# Patient Record
Sex: Female | Born: 1975 | Hispanic: Yes | Marital: Married | Smoking: Never smoker
Health system: Southern US, Community
[De-identification: ages and names within clinical notes are randomized; demographics above are authoritative.]

## PROBLEM LIST (undated history)

## (undated) DIAGNOSIS — J302 Other seasonal allergic rhinitis: Secondary | ICD-10-CM

## (undated) DIAGNOSIS — G43909 Migraine, unspecified, not intractable, without status migrainosus: Secondary | ICD-10-CM

## (undated) DIAGNOSIS — I209 Angina pectoris, unspecified: Secondary | ICD-10-CM

## (undated) DIAGNOSIS — F419 Anxiety disorder, unspecified: Secondary | ICD-10-CM

## (undated) DIAGNOSIS — C801 Malignant (primary) neoplasm, unspecified: Secondary | ICD-10-CM

## (undated) DIAGNOSIS — Z923 Personal history of irradiation: Secondary | ICD-10-CM

## (undated) HISTORY — PX: TUBAL LIGATION: SHX77

## (undated) HISTORY — PX: ABDOMINAL HYSTERECTOMY: SHX81

## (undated) HISTORY — DX: Malignant (primary) neoplasm, unspecified: C80.1

---

## 2005-03-28 ENCOUNTER — Inpatient Hospital Stay: Payer: Self-pay

## 2005-08-31 ENCOUNTER — Ambulatory Visit: Payer: Self-pay | Admitting: Otolaryngology

## 2007-09-12 ENCOUNTER — Ambulatory Visit: Payer: Self-pay | Admitting: Family Medicine

## 2009-10-04 ENCOUNTER — Emergency Department: Payer: Self-pay | Admitting: Emergency Medicine

## 2009-12-10 ENCOUNTER — Emergency Department: Payer: Self-pay | Admitting: Emergency Medicine

## 2011-01-16 ENCOUNTER — Emergency Department: Payer: Self-pay | Admitting: Emergency Medicine

## 2011-05-13 ENCOUNTER — Ambulatory Visit: Payer: Self-pay | Admitting: Obstetrics and Gynecology

## 2012-05-14 ENCOUNTER — Ambulatory Visit: Payer: Self-pay

## 2015-03-27 ENCOUNTER — Encounter: Payer: Self-pay | Admitting: *Deleted

## 2015-03-27 ENCOUNTER — Emergency Department: Payer: BLUE CROSS/BLUE SHIELD

## 2015-03-27 ENCOUNTER — Emergency Department
Admission: EM | Admit: 2015-03-27 | Discharge: 2015-03-27 | Disposition: A | Discharge status: Home / Self Care | Payer: BLUE CROSS/BLUE SHIELD | Attending: Emergency Medicine | Admitting: Emergency Medicine

## 2015-03-27 DIAGNOSIS — M79602 Pain in left arm: Secondary | ICD-10-CM | POA: Diagnosis not present

## 2015-03-27 DIAGNOSIS — R0602 Shortness of breath: Secondary | ICD-10-CM | POA: Diagnosis not present

## 2015-03-27 DIAGNOSIS — R1013 Epigastric pain: Secondary | ICD-10-CM | POA: Diagnosis not present

## 2015-03-27 DIAGNOSIS — R079 Chest pain, unspecified: Secondary | ICD-10-CM | POA: Diagnosis present

## 2015-03-27 LAB — BASIC METABOLIC PANEL
Anion gap: 4 — ABNORMAL LOW (ref 5–15)
BUN: 16 mg/dL (ref 6–20)
CALCIUM: 9 mg/dL (ref 8.9–10.3)
CHLORIDE: 109 mmol/L (ref 101–111)
CO2: 26 mmol/L (ref 22–32)
CREATININE: 0.7 mg/dL (ref 0.44–1.00)
GFR calc non Af Amer: >60 mL/min (ref >60)
Glucose, Bld: 106 mg/dL — ABNORMAL HIGH (ref 65–99)
Potassium: 3.6 mmol/L (ref 3.5–5.1)
Sodium: 139 mmol/L (ref 135–145)

## 2015-03-27 LAB — CBC
HCT: 40.5 % (ref 35.0–47.0)
Hemoglobin: 13.5 g/dL (ref 12.0–16.0)
MCH: 29.3 pg (ref 26.0–34.0)
MCHC: 33.4 g/dL (ref 32.0–36.0)
MCV: 87.8 fL (ref 80.0–100.0)
PLATELETS: 247 10*3/uL (ref 150–440)
RBC: 4.61 MIL/uL (ref 3.80–5.20)
RDW: 13 % (ref 11.5–14.5)
WBC: 8.9 10*3/uL (ref 3.6–11.0)

## 2015-03-27 LAB — TROPONIN I

## 2015-03-27 MED ORDER — GI COCKTAIL ~~LOC~~
30.0000 mL | Freq: Once | ORAL | Status: AC
  Administered 2015-03-27: 30 mL via ORAL
  Filled 2015-03-27: qty 30

## 2015-03-27 MED ORDER — OMEPRAZOLE 40 MG PO CPDR: 40.0000 mg | DELAYED_RELEASE_CAPSULE | Freq: Every day | ORAL | Status: DC

## 2015-03-27 NOTE — Discharge Instructions (Signed)
Please establish a primary care physician at the [facility] clinic for follow-up.  Please return to the emergency department if you develop severe pain, shortness of breath, palpitations, fainting, or any other symptoms concerning to you.

## 2015-03-27 NOTE — ED Notes (Signed)
Pt reports central chest pain radiating to left arm started yesterday

## 2015-03-27 NOTE — ED Notes (Signed)
MD at bedside. 

## 2015-03-27 NOTE — ED Provider Notes (Signed)
[facility] Emergency Department Provider Note  ____________________________________________  Time seen: Approximately 9:13 AM  I have reviewed the triage [place] signs and the nursing notes.   HISTORY  Chief Complaint Chest Pain    HPI Sherry Short is a 39 y.o. female ,otherwise healthy, presenting with chest pain and shortness of breath.Patient reports that for 6 months she's been having intermittent chest pain. She describes that it started when she was on vacation in [place] in July. She describes a pressure sensation in the left side of the chest that is associated with shortness of breath. It occurs approximately once weekly and can be brief or last for several days. It is better if she sits up but worse if she lies down. It is worse at night. It is not a burning sensation or related to food. She does not have associated burping. She denies any associated palpitations, leg swelling or calf pain, pleuritic component, nausea or vomiting, or fever. Her last episode started yesterday and is persistent today and is associated with left arm pain. She has not seen another physician about these symptoms.  SH: Denies tobacco or cocaine abuse FH: No family history of early CAD or blood clots. History reviewed. No pertinent past medical history.  There are no active problems to display for this patient.   History reviewed. No pertinent past surgical history.  Current Outpatient Rx  Name  Route  Sig  Dispense  Refill  . omeprazole (PRILOSEC) 40 MG capsule   Oral   Take 1 capsule (40 mg total) by mouth daily.   30 capsule   0     Allergies Review of patient's allergies indicates no known allergies.  No family history on file.  Social History Social History  Substance Use Topics  . Smoking status: Never Smoker   . Smokeless tobacco: None  . Alcohol Use: No    Review of Systems Constitutional: No fever/chills. No lightheadedness or syncope. No  diaphoresis Eyes: No visual changes. ENT: No sore throat. Cardiovascular: Positive chest pain, without palpitations. Respiratory: Positive shortness of breath.  No cough. Gastrointestinal: No abdominal pain.  No nausea, no vomiting.  No diarrhea.  No constipation. Genitourinary: Negative for dysuria. Musculoskeletal: Negative for back pain. No lower extremity swelling or calf pain.  Skin: Negative for rash. Neurological: Negative for headaches, focal weakness or numbness.  10-point ROS otherwise negative.  ____________________________________________   PHYSICAL EXAM:  [place] SIGNS: ED Triage Vitals  Enc Vitals Group     BP 03/27/15 0853 104/70 mmHg     Pulse Rate 03/27/15 0853 69     Resp 03/27/15 0853 18     Temp 03/27/15 0853 97.8 F (36.6 C)     Temp Source 03/27/15 0853 Oral     SpO2 03/27/15 0853 99 %     Weight 03/27/15 0851 120 lb (54.432 kg)     Height 03/27/15 0851 5\' 1"  (1.549 m)     Head Cir --      Peak Flow --      Pain Score 03/27/15 0851 8     Pain Loc --      Pain Edu? --      Excl. in [place]? --     Constitutional: Alert and oriented. Well appearing and in no acute distress. Answer question appropriately. Eyes: Conjunctivae are normal.  EOMI. no scleral icterus Head: Atraumatic. Nose: No congestion/rhinnorhea. Mouth/Throat: Mucous membranes are moist.  Neck: No stridor.  Supple.  No JVD Cardiovascular: Normal  rate, regular rhythm. No murmurs, rubs or gallops.  Respiratory: Normal respiratory effort.  No retractions. Lungs CTAB.  No wheezes, rales or ronchi. Gastrointestinal: Abdomen is soft, and nondistended. Minimal pain in the epigastric area and in the lower abdomen without rebound, guarding or peritoneal signs. Musculoskeletal: No LE edema. No palpable cords or tenderness to palpation in the calves. Negative Homans sign. Neurologic:  Normal speech and language. No gross focal neurologic deficits are appreciated.  Skin:  Skin is warm, dry and intact. No  rash noted. Psychiatric: Mood and affect are normal. Speech and behavior are normal.  Normal judgement.  ____________________________________________   LABS (all labs ordered are listed, but only abnormal results are displayed)  Labs Reviewed  BASIC METABOLIC PANEL - Abnormal; Notable for the following:    Glucose, Bld 106 (*)    Anion gap 4 (*)    All other components within normal limits  CBC  TROPONIN I   ____________________________________________  EKG  ED ECG REPORT I, Eula Listen, the attending physician, personally viewed and interpreted this ECG.   Date: 03/27/2015  EKG Time: 855  Rate: 73  Rhythm: normal sinus rhythm  Axis: Normal  Intervals:none  ST&T Change: Nonspecific T-wave inversion in V1. No ST elevation. No ischemic changes.  ____________________________________________  RADIOLOGY  No results found.  ____________________________________________   PROCEDURES  Procedure(s) performed: None  Critical Care performed: No ____________________________________________   INITIAL IMPRESSION / ASSESSMENT AND PLAN / ED COURSE  Pertinent labs & imaging results that were available during my care of the patient were reviewed by me and considered in my medical decision making (see chart for details).  39 y.o. female who is otherwise healthy and has no significant personal or family risk factors presenting with left-sided chest pain and shortness of breath episodes over the past 6 months. This would be an atypical presentation for ACS or MI, and a primary pulmonary pathology such as pneumonia or pneumothorax is very unlikely. She is not tachycardic, hypoxic, and has no sign of DVT or significant risk factors so PE is also very unlikely. Some of her signs and symptoms are suggestive of reflux disease so will evaluate her with a preliminary workup in the emergency department but if it is negative I anticipate discharging her home on an H2 blocker with  close PMD follow-up.  ____________________________________________  FINAL CLINICAL IMPRESSION(S) / ED DIAGNOSES  Final diagnoses:  Chest pain, unspecified chest pain type  Shortness of breath  Left arm pain      NEW MEDICATIONS STARTED DURING THIS VISIT:  Discharge Medication List as of 03/27/2015 10:06 AM    START taking these medications   Details  omeprazole (PRILOSEC) 40 MG capsule Take 1 capsule (40 mg total) by mouth daily., Starting 03/27/2015, Until Sat 03/26/16, Print          Eula Listen, MD 04/15/15 2247

## 2015-08-09 ENCOUNTER — Encounter: Payer: Self-pay | Admitting: Emergency Medicine

## 2015-08-09 ENCOUNTER — Emergency Department: Payer: BLUE CROSS/BLUE SHIELD

## 2015-08-09 ENCOUNTER — Emergency Department
Admission: EM | Admit: 2015-08-09 | Discharge: 2015-08-09 | Disposition: A | Discharge status: Home / Self Care | Payer: BLUE CROSS/BLUE SHIELD | Attending: Emergency Medicine | Admitting: Emergency Medicine

## 2015-08-09 DIAGNOSIS — G43909 Migraine, unspecified, not intractable, without status migrainosus: Secondary | ICD-10-CM | POA: Diagnosis present

## 2015-08-09 DIAGNOSIS — L03213 Periorbital cellulitis: Secondary | ICD-10-CM

## 2015-08-09 DIAGNOSIS — R609 Edema, unspecified: Secondary | ICD-10-CM

## 2015-08-09 HISTORY — DX: Migraine, unspecified, not intractable, without status migrainosus: G43.909

## 2015-08-09 HISTORY — DX: Other seasonal allergic rhinitis: J30.2

## 2015-08-09 LAB — URINALYSIS COMPLETE WITH MICROSCOPIC (ARMC ONLY)
BACTERIA UA: NONE SEEN
Bilirubin Urine: NEGATIVE
GLUCOSE, UA: NEGATIVE mg/dL
Ketones, ur: NEGATIVE mg/dL
Nitrite: NEGATIVE
Protein, ur: NEGATIVE mg/dL
Specific Gravity, Urine: 1.02 (ref 1.005–1.030)
pH: 5 (ref 5.0–8.0)

## 2015-08-09 LAB — BASIC METABOLIC PANEL
Anion gap: 7 (ref 5–15)
BUN: 16 mg/dL (ref 6–20)
CALCIUM: 9.2 mg/dL (ref 8.9–10.3)
CO2: 22 mmol/L (ref 22–32)
CREATININE: 1.02 mg/dL — AB (ref 0.44–1.00)
Chloride: 110 mmol/L (ref 101–111)
GFR calc Af Amer: >60 mL/min (ref >60)
GFR calc non Af Amer: >60 mL/min (ref >60)
GLUCOSE: 98 mg/dL (ref 65–99)
Potassium: 4.1 mmol/L (ref 3.5–5.1)
Sodium: 139 mmol/L (ref 135–145)

## 2015-08-09 LAB — LIPASE, BLOOD: Lipase: 19 U/L (ref 11–51)

## 2015-08-09 LAB — CBC
HCT: 40.2 % (ref 35.0–47.0)
HEMOGLOBIN: 13.6 g/dL (ref 12.0–16.0)
MCH: 29.4 pg (ref 26.0–34.0)
MCHC: 33.8 g/dL (ref 32.0–36.0)
MCV: 87.2 fL (ref 80.0–100.0)
PLATELETS: 274 10*3/uL (ref 150–440)
RBC: 4.61 MIL/uL (ref 3.80–5.20)
RDW: 13.5 % (ref 11.5–14.5)
WBC: 11.5 10*3/uL — ABNORMAL HIGH (ref 3.6–11.0)

## 2015-08-09 LAB — HEPATIC FUNCTION PANEL
ALBUMIN: 4.3 g/dL (ref 3.5–5.0)
ALK PHOS: 68 U/L (ref 38–126)
ALT: 13 U/L — ABNORMAL LOW (ref 14–54)
AST: 18 U/L (ref 15–41)
Bilirubin, Direct: <0.1 mg/dL — ABNORMAL LOW (ref 0.1–0.5)
TOTAL PROTEIN: 7.5 g/dL (ref 6.5–8.1)
Total Bilirubin: 0.7 mg/dL (ref 0.3–1.2)

## 2015-08-09 LAB — TROPONIN I

## 2015-08-09 MED ORDER — AMOXICILLIN-POT CLAVULANATE 875-125 MG PO TABS: 1.0000 | ORAL_TABLET | Freq: Two times a day (BID) | ORAL | Status: AC

## 2015-08-09 MED ORDER — IOPAMIDOL (ISOVUE-300) INJECTION 61%
75.0000 mL | Freq: Once | INTRAVENOUS | Status: AC | PRN
  Administered 2015-08-09: 75 mL via INTRAVENOUS

## 2015-08-09 NOTE — ED Provider Notes (Signed)
[facility] Emergency Department Provider Note   ____________________________________________  Time seen: Approximately 11:09 AM  I have reviewed the triage [place] signs and the nursing notes.   HISTORY  Chief Complaint Migraine   HPI Sherry Short is a 40 y.o. female patient reports she has chronic daily headaches and has been having this for at least 5 years. The headaches are tight diffuse and achy. She has had 2 days of pain and swelling around the right eye as well. Area is red and puffy and tender. She is not running a fever. Patient also complains of abdominal pain for about at least the last 2 years. She occasionally has some dizziness no nausea vomiting or diarrhea. Abdominal pain is diffuse, achy. Patient also complains of seasonal allergies runny eyes and sneezing and occasional cough. )**} Past Medical History  Diagnosis Date  . Migraine headache   . Seasonal allergies     There are no active problems to display for this patient.   History reviewed. No pertinent past surgical history.  Current Outpatient Rx  Name  Route  Sig  Dispense  Refill  . amoxicillin-clavulanate (AUGMENTIN) 875-125 MG tablet   Oral   Take 1 tablet by mouth every 12 (twelve) hours. For sinus/allergies.         . fexofenadine (ALLEGRA) 180 MG tablet   Oral   Take 180 mg by mouth daily as needed for allergies or rhinitis.         Marland Kitchen amoxicillin-clavulanate (AUGMENTIN) 875-125 MG tablet   Oral   Take 1 tablet by mouth 2 (two) times daily. Take with food   20 tablet   0   . omeprazole (PRILOSEC) 40 MG capsule   Oral   Take 1 capsule (40 mg total) by mouth daily.   30 capsule   0     Allergies Review of patient's allergies indicates no known allergies.  No family history on file.  Social History Social History  Substance Use Topics  . Smoking status: Never Smoker   . Smokeless tobacco: None  . Alcohol Use: No    Review of  Systems Constitutional: No fever/chills Eyes: No visual changes. ENT: No sore throat. Cardiovascular: Denies chest pain. Respiratory: Denies shortness of breath. Gastrointestinal: See history of present illness Genitourinary: Negative for dysuria. Musculoskeletal: Negative for back pain. Skin: Negative for rash. Neurological: Negative for focal weakness or numbness.  10-point ROS otherwise negative.  ____________________________________________   PHYSICAL EXAM:  [place] SIGNS: ED Triage Vitals  Enc Vitals Group     BP 08/09/15 0725 110/68 mmHg     Pulse Rate 08/09/15 0725 81     Resp 08/09/15 0725 18     Temp 08/09/15 0725 98.2 F (36.8 C)     Temp Source 08/09/15 0725 Oral     SpO2 08/09/15 0725 98 %     Weight 08/09/15 0725 130 lb (58.968 kg)     Height 08/09/15 0725 5\' 4"  (1.626 m)     Head Cir --      Peak Flow --      Pain Score 08/09/15 0725 9     Pain Loc --      Pain Edu? --      Excl. in [place]? --     Constitutional: Alert and oriented. Well appearing and in no acute distress. Eyes: Conjunctivae are normal. PERRL. EOMI.There is redness and swelling around the right eye including the eyelid lower eyelid in the upper part of  the maxillary area. This is tender to palpation the eyes open. Head: Atraumatic. Nose: No congestion/rhinnorhea. Mouth/Throat: Mucous membranes are moist.  Oropharynx non-erythematous. Neck: No stridor.   Cardiovascular: Normal rate, regular rhythm. Grossly normal heart sounds.  Good peripheral circulation. Respiratory: Normal respiratory effort.  No retractions. Lungs CTAB. Gastrointestinal: Soft and mildly diffusely tender. Patient reports this is chronic. No distention. No abdominal bruits. No CVA tenderness. Musculoskeletal: No lower extremity tenderness nor edema.  No joint effusions. Neurologic:  Normal speech and language. No gross focal neurologic deficits are appreciated. No gait instability. Skin:  Skin is warm, dry and intact. No  rash noted. Psychiatric: Mood and affect are normal. Speech and behavior are normal.  ____________________________________________   LABS (all labs ordered are listed, but only abnormal results are displayed)  Labs Reviewed  BASIC METABOLIC PANEL - Abnormal; Notable for the following:    Creatinine, Ser 1.02 (*)    All other components within normal limits  CBC - Abnormal; Notable for the following:    WBC 11.5 (*)    All other components within normal limits  URINALYSIS COMPLETEWITH MICROSCOPIC ([facility] ONLY) - Abnormal; Notable for the following:    Color, Urine YELLOW (*)    APPearance CLEAR (*)    Hgb urine dipstick 2+ (*)    Leukocytes, UA TRACE (*)    Squamous Epithelial / LPF 6-30 (*)    All other components within normal limits  HEPATIC FUNCTION PANEL - Abnormal; Notable for the following:    ALT 13 (*)    Bilirubin, Direct <0.1 (*)    All other components within normal limits  TROPONIN I  LIPASE, BLOOD   ____________________________________________  EKG  EKG read and interpreted by me shows normal sinus rhythm rate of 76 normal axis no acute ST-T wave changes ____________________________________________  RADIOLOGY  CT of the head is read as normal CT of the maxillofacial area read by radiology as consistent with cellulitis. ____________________________________________   PROCEDURES ____________________________________________   INITIAL IMPRESSION / ASSESSMENT AND PLAN / ED COURSE  Pertinent labs & imaging results that were available during my care of the patient were reviewed by me and considered in my medical decision making (see chart for details).   ____________________________________________   FINAL CLINICAL IMPRESSION(S) / ED DIAGNOSES  Final diagnoses:  Preseptal cellulitis      NEW MEDICATIONS STARTED DURING THIS VISIT:  Discharge Medication List as of 08/09/2015 11:25 AM    START taking these medications   Details  !!  amoxicillin-clavulanate (AUGMENTIN) 875-125 MG tablet Take 1 tablet by mouth 2 (two) times daily. Take with food, Starting 08/09/2015, Until Wed 08/19/15, Print     !! - Potential duplicate medications found. Please discuss with provider.       Note:  This document was prepared using Dragon voice recognition software and may include unintentional dictation errors.    Nena Polio, MD 08/09/15 [phone]

## 2015-08-09 NOTE — ED Notes (Signed)
Pt presents to ED with reports of headache, rhinorrhea and itching and watery eyes. Pt reports cough. Pt reports chest pain. Pt states has history of migraine headache.

## 2015-08-09 NOTE — Discharge Instructions (Signed)
Please return if she develops a fever and swelling gets worse she had experienced any pain with eye movement.

## 2016-04-11 DIAGNOSIS — Z923 Personal history of irradiation: Secondary | ICD-10-CM

## 2016-04-11 HISTORY — DX: Personal history of irradiation: Z92.3

## 2016-11-09 ENCOUNTER — Other Ambulatory Visit: Payer: Self-pay | Admitting: Family Medicine

## 2016-11-09 DIAGNOSIS — N6320 Unspecified lump in the left breast, unspecified quadrant: Secondary | ICD-10-CM

## 2016-11-14 ENCOUNTER — Other Ambulatory Visit: Payer: BLUE CROSS/BLUE SHIELD

## 2016-11-14 ENCOUNTER — Ambulatory Visit
Admission: RE | Admit: 2016-11-14 | Discharge: 2016-11-14 | Disposition: A | Discharge status: Home / Self Care | Payer: Medicaid Other | Source: Ambulatory Visit | Attending: Family Medicine | Admitting: Family Medicine

## 2016-11-14 DIAGNOSIS — N6320 Unspecified lump in the left breast, unspecified quadrant: Secondary | ICD-10-CM

## 2016-11-14 DIAGNOSIS — N6321 Unspecified lump in the left breast, upper outer quadrant: Secondary | ICD-10-CM | POA: Insufficient documentation

## 2016-11-14 DIAGNOSIS — R921 Mammographic calcification found on diagnostic imaging of breast: Secondary | ICD-10-CM | POA: Insufficient documentation

## 2016-11-15 ENCOUNTER — Other Ambulatory Visit: Payer: Self-pay | Admitting: Family Medicine

## 2016-11-15 DIAGNOSIS — R921 Mammographic calcification found on diagnostic imaging of breast: Secondary | ICD-10-CM

## 2016-11-15 DIAGNOSIS — N6320 Unspecified lump in the left breast, unspecified quadrant: Secondary | ICD-10-CM

## 2016-11-18 ENCOUNTER — Other Ambulatory Visit: Payer: BLUE CROSS/BLUE SHIELD

## 2016-11-30 ENCOUNTER — Ambulatory Visit
Admission: RE | Admit: 2016-11-30 | Discharge: 2016-11-30 | Disposition: A | Discharge status: Home / Self Care | Payer: BLUE CROSS/BLUE SHIELD | Source: Ambulatory Visit | Attending: Family Medicine | Admitting: Family Medicine

## 2016-11-30 DIAGNOSIS — D0512 Intraductal carcinoma in situ of left breast: Secondary | ICD-10-CM | POA: Insufficient documentation

## 2016-11-30 DIAGNOSIS — N6320 Unspecified lump in the left breast, unspecified quadrant: Secondary | ICD-10-CM

## 2016-11-30 DIAGNOSIS — Z9889 Other specified postprocedural states: Secondary | ICD-10-CM | POA: Insufficient documentation

## 2016-11-30 DIAGNOSIS — R921 Mammographic calcification found on diagnostic imaging of breast: Secondary | ICD-10-CM | POA: Insufficient documentation

## 2016-11-30 HISTORY — PX: BREAST BIOPSY: SHX20

## 2016-12-01 LAB — SURGICAL PATHOLOGY

## 2016-12-05 ENCOUNTER — Other Ambulatory Visit: Payer: Self-pay | Admitting: Family Medicine

## 2016-12-05 DIAGNOSIS — R928 Other abnormal and inconclusive findings on diagnostic imaging of breast: Secondary | ICD-10-CM

## 2016-12-05 DIAGNOSIS — R921 Mammographic calcification found on diagnostic imaging of breast: Secondary | ICD-10-CM

## 2016-12-06 ENCOUNTER — Encounter: Payer: Self-pay | Admitting: *Deleted

## 2016-12-06 NOTE — Progress Notes (Signed)
The interpreter Randall Hiss called back around 5:00 and we were able to get in touch with the patient.  She does not have a surgical preference.  Since it is after 5:00 today, I will need to call her back tomorrow with her appointments.  She is agreeable.

## 2016-12-06 NOTE — Progress Notes (Signed)
  Oncology Nurse Navigator Documentation  Navigator Location: [facility] (12/06/16 1600)   )Navigator Encounter Type: Introductory phone call (12/06/16 1600)   Abnormal Finding Date: 11/14/16 (12/06/16 1600) Confirmed Diagnosis Date: 12/02/16 (12/06/16 1600)                                                  Called patient's ordering physician, Dr. Mariana Arn to see if they wanted the navigator to schedule the patient a surgical consult and medical oncology consult.  Dr. Sherilyn Cooter was not in the office, but per the covering MD, Dr. Fulton Mole, it is ok to assist with scheduling the patient.  I tried to call the patient via Bonnita Nasuti, the interpreter, but there was no answer.  I will try again tomorrow.

## 2016-12-08 NOTE — Progress Notes (Signed)
  Oncology Nurse Navigator Documentation  Navigator Location: [facility] (12/08/16 1400)   )Navigator Encounter Type: Telephone (12/08/16 1400)                       Treatment Phase: Pre-Tx/Tx Discussion (12/08/16 1400) Barriers/Navigation Needs: Coordination of Care (12/08/16 1400)   Interventions: Coordination of Care (12/08/16 1400)   Coordination of Care: Appts (12/08/16 1400)                  Time Spent with Patient: 30 (12/08/16 1400)   Phoned patient with interpreter interpreter Lannie Fields [place] to confirm appointments with surgery, and Med/onc.  Patient is scheduled with Dr. Rochel Brome on 12/15/16 at 10:30 for surgical consult, and at 2:00 with Dr. Grayland Ormond for Med/Onc consult.

## 2016-12-09 ENCOUNTER — Ambulatory Visit: Payer: BLUE CROSS/BLUE SHIELD | Admitting: Oncology

## 2016-12-13 ENCOUNTER — Ambulatory Visit
Admission: RE | Admit: 2016-12-13 | Discharge: 2016-12-13 | Disposition: A | Discharge status: Home / Self Care | Payer: Medicaid Other | Source: Ambulatory Visit | Attending: Family Medicine | Admitting: Family Medicine

## 2016-12-13 DIAGNOSIS — N6092 Unspecified benign mammary dysplasia of left breast: Secondary | ICD-10-CM | POA: Insufficient documentation

## 2016-12-13 DIAGNOSIS — R928 Other abnormal and inconclusive findings on diagnostic imaging of breast: Secondary | ICD-10-CM

## 2016-12-13 DIAGNOSIS — R921 Mammographic calcification found on diagnostic imaging of breast: Secondary | ICD-10-CM

## 2016-12-13 DIAGNOSIS — C801 Malignant (primary) neoplasm, unspecified: Secondary | ICD-10-CM

## 2016-12-13 HISTORY — DX: Malignant (primary) neoplasm, unspecified: C80.1

## 2016-12-13 HISTORY — PX: BREAST BIOPSY: SHX20

## 2016-12-14 DIAGNOSIS — D0512 Intraductal carcinoma in situ of left breast: Secondary | ICD-10-CM | POA: Insufficient documentation

## 2016-12-14 LAB — SURGICAL PATHOLOGY

## 2016-12-14 NOTE — Progress Notes (Signed)
Sherry Short  [phone] [phone]  ID: Sherry Short OB: Sep 19, 1975  MR#: [id number]  AFB#:[id number]  Patient Care Team: Patient, No Pcp Per as PCP - General (General Practice)  CHIEF COMPLAINT: Left breast DCIS.  INTERVAL HISTORY: Patient is a 41 year old female who recently noticed an abnormal lump on self breast examination. 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 She is anxious, but otherwise feels well. She has no neurologic complaints. She denies any recent fevers or illnesses. She has good appetite and denies weight loss. She denies any pain. She has no chest pain or shortness of breath. She denies any nausea, vomiting, constipation, or diarrhea. She has no urinary complaints. Patient feels at her baseline and offers no further specific complaints today.  REVIEW OF SYSTEMS:   Review of Systems  Constitutional: Negative.  Negative for fever, malaise/fatigue and weight loss.  Respiratory: Negative.  Negative for cough and shortness of breath.   Cardiovascular: Negative.  Negative for chest pain and leg swelling.  Gastrointestinal: Negative.  Negative for abdominal pain.  Genitourinary: Negative.  Negative for dysuria.  Musculoskeletal: Negative.   Skin: Negative.  Negative for rash.  Neurological: Negative.  Negative for sensory change and weakness.  Psychiatric/Behavioral: The patient is nervous/anxious.     As per HPI. Otherwise, a complete review of systems is negative.  PAST MEDICAL HISTORY: Past Medical History:  Diagnosis Date  . Migraine headache   . Seasonal allergies     PAST SURGICAL HISTORY: Past Surgical History:  Procedure Laterality Date  . BREAST BIOPSY Left 11/30/2016   left breast calcs path pending  . BREAST BIOPSY Left 12/13/2016   Left Affrim Bx- Path pending  . BREAST BIOPSY Left 12/13/2016   Left Affirm Bx- Path pending    FAMILY HISTORY: Family History  Problem  Relation Age of Onset  . Breast cancer Sister 81    ADVANCED DIRECTIVES (Y/N):  N  HEALTH MAINTENANCE: Social History  Substance Use Topics  . Smoking status: Never Smoker  . Smokeless tobacco: Not on file  . Alcohol use No     Colonoscopy:  PAP:  Bone density:  Lipid panel:  No Known Allergies  Current Outpatient Prescriptions  Medication Sig Dispense Refill  . acetaminophen (TYLENOL) 325 MG tablet Take 650 mg by mouth every 6 (six) hours as needed.     No current facility-administered medications for this visit.     OBJECTIVE: Vitals:   12/15/16 1420  BP: 128/72  Pulse: 73  Resp: 20  Temp: (!) 97.1 F (36.2 C)     Body mass index is 23.67 kg/m.    ECOG FS:0 - Asymptomatic  General: Well-developed, well-nourished, no acute distress. Eyes: Pink conjunctiva, anicteric sclera. HEENT: Normocephalic, moist mucous membranes, clear oropharnyx. Breasts: Patient requested exam be deferred today. Lungs: Clear to auscultation bilaterally. Heart: Regular rate and rhythm. No rubs, murmurs, or gallops. Abdomen: Soft, nontender, nondistended. No organomegaly noted, normoactive bowel sounds. Musculoskeletal: No edema, cyanosis, or clubbing. Neuro: Alert, answering all questions appropriately. Cranial nerves grossly intact. Skin: No rashes or petechiae noted. Psych: Normal affect. Lymphatics: No cervical, calvicular, axillary or inguinal LAD.   LAB RESULTS:  Lab Results  Component Value Date   NA 139 08/09/2015   K 4.1 08/09/2015   CL 110 08/09/2015   CO2 22 08/09/2015   GLUCOSE 98 08/09/2015   BUN 16 08/09/2015   CREATININE 1.02 (H) 08/09/2015   CALCIUM 9.2 08/09/2015   PROT 7.5 08/09/2015  ALBUMIN 4.3 08/09/2015   AST 18 08/09/2015   ALT 13 (L) 08/09/2015   ALKPHOS 68 08/09/2015   BILITOT 0.7 08/09/2015   GFRNONAA >60 08/09/2015   GFRAA >60 08/09/2015    Lab Results  Component Value Date   WBC 11.5 (H) 08/09/2015   HGB 13.6 08/09/2015   HCT 40.2  08/09/2015   MCV 87.2 08/09/2015   PLT 274 08/09/2015     STUDIES: Mm Clip Placement Left  Result Date: 12/13/2016 CLINICAL DATA:  Two stereotactic biopsies of the left breast were performed today. These include calcifications targeted in the lower inner quadrant of the left breast and calcifications in the central left breast. 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 EXAM: DIAGNOSTIC LEFT MAMMOGRAM POST STEREOTACTIC BIOPSIES COMPARISON:  Previous exam(s). FINDINGS: Mammographic images were obtained following stereotactic guided biopsies of the lower inner quadrant of the left breast and the central left breast. A coil shaped biopsy clip is present along the anterior aspect of the biopsied calcifications in the lower inner quadrant of the left breast. Please note that this biopsy clip is approximately 4 cm posterior, inferior, and medial to the ribbon shaped biopsy clip at the site of the recently diagnosed ductal carcinoma in situ in the retroareolar left breast. An X shaped biopsy clip was placed during the second biopsy of calcifications in the central left breast. This X shaped biopsy clip migrated superiorly. It is positioned approximately 3.5 cm superior to the calcifications in the central left breast and should not be used for targeting purposes. There are still visible calcifications within this small group that could be targeted if necessary. IMPRESSION: 1. Satisfactory position of a coil shaped biopsy clip along the anterior aspect of the calcifications in the lower inner quadrant of the left breast. 2. The X shaped biopsy clip migrated 3.5 cm superior to the small group of calcifications which were targeted for biopsy in the central left breast. At least some of these calcifications remain visible and could be targeted as needed. Final Assessment: Post Procedure Mammograms for  Marker Placement Electronically Signed   By: Curlene Dolphin M.D.   On: 12/13/2016 14:47   Mm Clip Placement Left  Result Date: 11/30/2016 CLINICAL DATA:  Status post stereotactic guided core biopsy of left breast calcifications in the anterior lower inner quadrant. Status post ultrasound-guided core biopsy of a mass in the 1 o'clock location of the left breast. EXAM: DIAGNOSTIC LEFT MAMMOGRAM POST ULTRASOUND AND STEREOTACTIC GUIDED CORE BIOPSIES. COMPARISON:  Previous exam(s). FINDINGS: Mammographic images were obtained following stereotactic guided biopsy of calcifications in the anterior lower inner quadrant of the left breast. A ribbon shaped clip is identified in the lower inner quadrant as expected. A wing shaped clip is identified in the upper-outer quadrant of the left breast, associated with previously evaluated palpable mass. The clips are 15 cm apart. IMPRESSION: Tissue marker clips are in the expected locations after biopsy. Final Assessment: Post Procedure Mammograms for Marker Placement Electronically Signed   By: Nolon Nations M.D.   On: 11/30/2016 09:42   Mm Lt Breast Bx W Loc Dev 1st Lesion Image Bx Spec Stereo Guide  Result Date: 12/13/2016 CLINICAL DATA:  Left breast stereotactic biopsy was recommended of 2 additional areas in the left breast, including the lower inner quadrant, and the central left breast. The patient has a recent diagnosis of ductal carcinoma in situ following a biopsy of  calcifications in the retroareolar left breast. She was also diagnosed with a fibroepithelial lesion in the upper-outer quadrant of the left breast for which excision was recommended. A Spanish interpreter was present throughout the procedures today. This report describes the biopsy targeting the calcifications in the lower inner quadrant. EXAM: LEFT BREAST STEREOTACTIC CORE NEEDLE BIOPSY COMPARISON:  Previous exams. FINDINGS: The patient and I discussed the procedure of stereotactic-guided biopsy  including benefits and alternatives. We discussed the high likelihood of a successful procedure. We discussed the risks of the procedure including infection, bleeding, tissue injury, clip migration, and inadequate sampling. Informed written consent was given. The usual time out protocol was performed immediately prior to the procedure. Using sterile technique and 1% Lidocaine as local anesthetic, under stereotactic guidance, a 9 gauge vacuum assisted device was used to perform core needle biopsy of calcifications in the lower inner quadrant of the left breast using a medial to lateral approach. Specimen radiograph was performed showing a few faint calcifications. Specimens with calcifications are identified for pathology. Lesion quadrant: Lower inner quadrant At the conclusion of the procedure, a coil shaped tissue marker clip was deployed into the biopsy cavity. Follow-up 2-view mammogram was performed and dictated separately. IMPRESSION: Stereotactic-guided biopsy of the lower inner quadrant of the left breast. No apparent complications. Electronically Signed   By: Curlene Dolphin M.D.   On: 12/13/2016 14:11   Mm Lt Breast Bx W Loc Dev 1st Lesion Image Bx Spec Stereo Guide  Addendum Date: 12/02/2016   ADDENDUM REPORT: 12/02/2016 13:15 ADDENDUM: Pathology results are available. The stereotactic biopsy of left lower inner quadrant calcifications shows high-grade comedo type ductal carcinoma in situ associated with comedonecrosis and calcifications. Pathology results are concordant with imaging findings. Pathology results of an ultrasound-guided left breast mass biopsy at 1 o'clock position 10 cm from the nipple shows fibroepithelial lesion, with a differential diagnosis including fibroadenoma with prominent intracanalicular growth pattern and phyllodes tumor. Pathology results are concordant with imaging findings. Given that phyllodes tumor is in the differential diagnosis, excision is recommended. The management  of this mass will be determined after additional stereotactic biopsies are performed of the left breast, and after surgical consultation to determine if the patient is a candidate for breast conservation. Please note that there are 2 additional groups of calcifications in the left breast which have not been biopsied. Given the presence of ductal carcinoma in situ at the biopsy of the most anterior calcifications, two additional stereotactic biopsies are recommended for evaluation of a group of calcifications in the central/slightly inferior left breast and in the lower inner quadrant of the left breast, more posterior than the calcifications which have RAD been biopsied. I gave results to the patient by telephone today, on a joint call with a Spanish interpreter. She is aware that our office will contact her early next week to schedule two additional stereotactic biopsies of the left breast. She also understands that the biopsied mass, while not a malignancy, needs to be excised, unless mastectomy is performed. The patient reports mild soreness at the biopsy sites, but is otherwise doing well. Additionally, breast MRI could be considered given the patient's dense breast tissue, and her family history of breast cancer in her sister, who was diagnosed at age 19. Electronically Signed   By: Curlene Dolphin M.D.   On: 12/02/2016 13:15   Result Date: 12/02/2016 CLINICAL DATA:  Patient presents for stereotactic guided core biopsy of calcifications in the anterior lower inner quadrant of the left breast.  Ultrasound-guided core biopsy of a mass in the 1 o'clock location of the left breast is also performed on the same day and is dictated separately. EXAM: LEFT BREAST STEREOTACTIC CORE NEEDLE BIOPSY COMPARISON:  Previous exams. FINDINGS: The patient and I discussed the procedure of stereotactic-guided biopsy including benefits and alternatives. We discussed the high likelihood of a successful procedure. We discussed the  risks of the procedure including infection, bleeding, tissue injury, clip migration, and inadequate sampling. Informed written consent was given. The usual time out protocol was performed immediately prior to the procedure. Using sterile technique and 1% Lidocaine as local anesthetic, under stereotactic guidance, a 9 gauge vacuum assisted device was used to perform core needle biopsy of calcifications in the anterior lower inner quadrant of the left breast using a medial approach. Specimen radiograph was performed showing calcifications to be present. Specimens with calcifications are identified for pathology. Lesion quadrant: Lower inner quadrant left breast At the conclusion of the procedure, a ribbon shaped tissue marker clip was deployed into the biopsy cavity. Follow-up 2-view mammogram was performed and dictated separately. IMPRESSION: Stereotactic-guided biopsy of left breast calcifications. No apparent complications. Electronically Signed: By: Nolon Nations M.D. On: 11/30/2016 09:45   Mm Lt Breast Bx W Loc Dev Ea Ad Lesion Img Bx Spec Stereo Guide  Result Date: 12/13/2016 CLINICAL DATA:  The patient presents for biopsy of 2 separate areas of calcifications in the left breast. She was recently diagnosed with ductal carcinoma in situ of the left breast following stereotactic biopsy of retroareolar left breast calcifications. This biopsy was targeting the centrally positioned small group of calcifications in the left breast. EXAM: LEFT BREAST STEREOTACTIC CORE NEEDLE BIOPSY COMPARISON:  Previous exams. FINDINGS: The patient and I discussed the procedure of stereotactic-guided biopsy including benefits and alternatives. We discussed the high likelihood of a successful procedure. We discussed the risks of the procedure including infection, bleeding, tissue injury, clip migration, and inadequate sampling. Informed written consent was given. The usual time out protocol was performed immediately prior to the  procedure. Using sterile technique and 1% Lidocaine as local anesthetic, under stereotactic guidance, a 9 gauge vacuum assisted device was used to perform core needle biopsy of calcifications in the central left breast using a superior to inferior approach. Specimen radiograph was performed showing a few faint calcifications. Specimens with calcifications are identified for pathology. Lesion quadrant: Lower outer quadrant At the conclusion of the procedure, a X shaped tissue marker clip was deployed into the biopsy cavity. Follow-up 2-view mammogram was performed and dictated separately. IMPRESSION: Stereotactic-guided biopsy of the left breast. No apparent complications. Electronically Signed   By: Curlene Dolphin M.D.   On: 12/13/2016 15:03   [place] Lt Breast Bx W Loc Dev 1st Lesion Img Bx Spec US Guide  Result Date: 11/30/2016 CLINICAL DATA:  Patient presents for ultrasound-guided core biopsy of mass in the 1 o'clock location of the left breast. Stereotactic guided core biopsy is also performed of left breast calcifications and is dictated separately. EXAM: ULTRASOUND GUIDED LEFT BREAST CORE NEEDLE BIOPSY COMPARISON:  Previous exam(s). FINDINGS: I met with the patient and we discussed the procedure of ultrasound-guided biopsy, including benefits and alternatives. We discussed the high likelihood of a successful procedure. We discussed the risks of the procedure, including infection, bleeding, tissue injury, clip migration, and inadequate sampling. Informed written consent was given. The usual time-out protocol was performed immediately prior to the procedure. Lesion quadrant: Upper-outer quadrant left breast Using sterile technique and 1% Lidocaine as  local anesthetic, under direct ultrasound visualization, a 12 gauge spring-loaded device was used to perform biopsy of mass in the 1 o'clock location of the left breast using a lateral approach. At the conclusion of the procedure a wing shaped tissue marker clip was  deployed into the biopsy cavity. Follow up 2 view mammogram was performed and dictated separately. IMPRESSION: Ultrasound guided biopsy of left breast mass. No apparent complications. Electronically Signed   By: Nolon Nations M.D.   On: 11/30/2016 09:43    ASSESSMENT: Left breast DCIS.  PLAN:    1. Left breast DCIS: Imaging and pathology reviewed independently confirming DCIS without invasive component. Patient had a second biopsy earlier this week did not reveal any malignancy or DCIS. She has an appointment with surgery next week for further evaluation and discussion of a lumpectomy. Patient will return to clinic first week of October for further evaluation and treatment planning. If there remains no invasive component to her disease, she will not require chemotherapy and will have evaluation with radiation oncology at that time. Patient also require approximately 5 years of tamoxifen at the conclusion of her treatments. 2. Family history: Patient reports her sister had breast cancer at the age of 51 and was treated at [facility]. It is unclear if she was genetically tested. Will pursue these results. If positive or sister was untested, will recommend genetic testing for patient.  Approximately 60 minutes was spent in discussion of which greater than 50% was consultation.  The entire visit was done in the presence of an interpreter.  Patient expressed understanding and was in agreement with this plan. She also understands that She can call clinic at any time with any questions, concerns, or complaints.   Cancer Staging Ductal carcinoma in situ (DCIS) of left breast Staging form: Breast, AJCC 8th Edition - Clinical stage from 12/15/2016: Stage 0 (cTis (DCIS), cN0, cM0, ER: Positive, PR: Positive, HER2: Negative) - Signed by Lloyd Huger, MD on 12/15/2016   Lloyd Huger, MD   12/15/2016 4:56 PM

## 2016-12-15 ENCOUNTER — Inpatient Hospital Stay: Payer: Self-pay | Attending: Oncology | Admitting: Oncology

## 2016-12-15 ENCOUNTER — Other Ambulatory Visit: Payer: Self-pay

## 2016-12-15 ENCOUNTER — Encounter (INDEPENDENT_AMBULATORY_CARE_PROVIDER_SITE_OTHER): Payer: Self-pay

## 2016-12-15 DIAGNOSIS — D0512 Intraductal carcinoma in situ of left breast: Secondary | ICD-10-CM | POA: Insufficient documentation

## 2016-12-15 DIAGNOSIS — F419 Anxiety disorder, unspecified: Secondary | ICD-10-CM | POA: Insufficient documentation

## 2016-12-15 DIAGNOSIS — Z803 Family history of malignant neoplasm of breast: Secondary | ICD-10-CM | POA: Insufficient documentation

## 2016-12-15 NOTE — Progress Notes (Signed)
Patient here today for initial visit regarding DCIS.

## 2016-12-15 NOTE — Progress Notes (Signed)
  Oncology Nurse Navigator Documentation  Navigator Location: [facility]-Med Onc (12/15/16 1700)   )Navigator Encounter Type: Clinic/MDC (12/15/16 1700)                         Barriers/Navigation Needs: Coordination of Care;Education;Financial (12/15/16 1700) Education: Accessing Care/ Finding Providers;Concerns with Finances/ Eligibility;Coping with Diagnosis/ Prognosis;Newly Diagnosed Cancer Education (12/15/16 1700) Interventions: Coordination of Care (12/15/16 1700)   Coordination of Care: Appts (Financial Assistance program) (12/15/16 1700)                  Time Spent with Patient: 120 (12/15/16 1700)   Patient identified as self-pay. Has Family planning Medicaid.   Rescheduled Surgical Consult with Dr. Adonis Huguenin at [place] on 12/21/16.  Left message for DSS to see if patient qualifies for Medicaid.  Accompanied patient to first Med/Onc visit. Patient given Financial assistance formes and guidelines.  She is to find out if her sister had genetic testing at [facility] . Sister was diagnosed with breast cancer at age 24. She is now 26. Patient has 4 daughters ranging from 4-11 years old. Given Breast Cancer Treatment Handbook/folder with hospital services. Maritza Afanador interpreted consult.

## 2016-12-16 DIAGNOSIS — G43909 Migraine, unspecified, not intractable, without status migrainosus: Secondary | ICD-10-CM | POA: Insufficient documentation

## 2016-12-16 DIAGNOSIS — J302 Other seasonal allergic rhinitis: Secondary | ICD-10-CM | POA: Insufficient documentation

## 2016-12-21 ENCOUNTER — Ambulatory Visit (INDEPENDENT_AMBULATORY_CARE_PROVIDER_SITE_OTHER): Payer: Self-pay | Admitting: General Surgery

## 2016-12-21 ENCOUNTER — Encounter: Payer: Self-pay | Admitting: General Surgery

## 2016-12-21 VITALS — BP 126/80 | HR 72 | Temp 98.3°F | Ht <= 58 in | Wt 137.0 lb

## 2016-12-21 DIAGNOSIS — D0512 Intraductal carcinoma in situ of left breast: Secondary | ICD-10-CM

## 2016-12-21 NOTE — Patient Instructions (Signed)
Le vamos a ser su cirugia el 24 de Septiembre del 2018. Su cirujano va a ser el Dr. Adonis Huguenin.  Si tiene preguntas me puede llamar al [phone] Herb Grays).

## 2016-12-21 NOTE — Progress Notes (Signed)
Patient ID: Sherry Short, female   DOB: 06-09-1975, 41 y.o.   MRN: [id number]  CC: left breast DCIS  HPI Sherry Short is a 41 y.o. female presents to clinic today for evaluation of left breast DCIS. Patient has a significant family history of a sister who was diagnosed with breast cancer at the age of 35. She started having mammography at age 19 that were all negative up until this year. She reports prior to the mammography she actually felt the lump which caused her to call and schedule a mammogram. She's never had a biopsy done before. However with this lump it was found to be suspicious and a biopsy returned a result of DCIS. She's had 5 pregnancies with 4 live births. She denies having breast-fed or using any hormone supplementation. She denies tobacco use as well. Currently she denies any fevers, chills, nausea, vomiting, shortness breath, chest pain, diarrhea, constipation. She does have some tenderness to the left breast and left arm. She is otherwise in her usual state of health.  HPI  Past Medical History:  Diagnosis Date  . Cancer ([place]) 12/13/2016   Left Breast (DCIS)  . Migraine headache   . Seasonal allergies     Past Surgical History:  Procedure Laterality Date  . BREAST BIOPSY Left 11/30/2016   left breast calcs path pending  . BREAST BIOPSY Left 12/13/2016   Left Affrim Bx- Path pending  . BREAST BIOPSY Left 12/13/2016   Left Affirm Bx- Path pending    Family History  Problem Relation Age of Onset  . Breast cancer Sister 61  . Healthy Mother   . Diabetes Father     Social History Social History  Substance Use Topics  . Smoking status: Never Smoker  . Smokeless tobacco: Never Used  . Alcohol use No    No Known Allergies  Current Outpatient Prescriptions  Medication Sig Dispense Refill  . acetaminophen (TYLENOL) 325 MG tablet Take 650 mg by mouth every 6 (six) hours as needed.     No current facility-administered medications for this visit.       Review of Systems A Multi-point review of systems was asked and was negative except for the findings documented in the history of present illness  Physical Exam Blood pressure 126/80, pulse 72, temperature 98.3 F (36.8 C), temperature source Oral, height 4\' 6"  (1.372 m), weight 62.1 kg (137 lb), last menstrual period 12/05/2016. CONSTITUTIONAL: No acute distress. EYES: Pupils are equal, round, and reactive to light, Sclera are non-icteric. EARS, NOSE, MOUTH AND THROAT: The oropharynx is clear. The oral mucosa is pink and moist. Hearing is intact to voice. BREAST: Bilateral breasts were examined. Right breast without any pathologic finding, lump, bump. Normal tissue. Left breast with a palpable, tender area with Steri-Strips still attached at approximately the 1:00 position 5 cm from the nipple areolar complex. Remainder of the breast is normal on exam. LYMPH NODES:  Lymph nodes in the neck and bilateral axilla are normal. RESPIRATORY:  Lungs are clear. There is normal respiratory effort, with equal breath sounds bilaterally, and without pathologic use of accessory muscles. CARDIOVASCULAR: Heart is regular without murmurs, gallops, or rubs. GI: The abdomen is soft, nontender, and nondistended. There are no palpable masses. There is no hepatosplenomegaly. There are normal bowel sounds in all quadrants. GU: Rectal deferred.   MUSCULOSKELETAL: Normal muscle strength and tone. No cyanosis or edema.   SKIN: Turgor is good and there are no pathologic skin lesions or ulcers. NEUROLOGIC: Motor  and sensation is grossly normal. Cranial nerves are grossly intact. PSYCH:  Oriented to person, place and time. Affect is normal.  Data Reviewed Images and labs reviewed. Images show a 2.6 x 1.7 x 2.8 cm hypoechoic mass in the area of palpable abnormality with some calcifications as well. Stereotactic biopsy returned DCIS. I have personally reviewed the patient's imaging, laboratory findings and medical  records.    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 Discussed the surgical options of lumpectomy with sentinel lymph node biopsy versus mastectomy with sentinel lymph node biopsy. Discussed the risks, benefits, alternatives of each. After this discussion patient states that she'll prefer to have a lumpectomy and voiced understanding that for her to be equal to mastectomy and has to be followed by radiation therapy as well. The procedure was described in detail to include the risks of pain, bleeding, infection, possible need to return to the operating room for more tissue, possible damage to the axillary nerves. She voiced understanding and desires to proceed. We will plan to proceed to the operating room on Monday, September 24.     Time spent with the patient was 45 minutes, with more than 50% of the time spent in face-to-face education, counseling and care coordination.     Clayburn Pert, MD FACS General Surgeon 12/21/2016, 2:18 PM

## 2016-12-22 ENCOUNTER — Other Ambulatory Visit: Payer: Self-pay | Admitting: General Surgery

## 2016-12-22 ENCOUNTER — Other Ambulatory Visit: Payer: Self-pay

## 2016-12-22 ENCOUNTER — Telehealth: Payer: Self-pay

## 2016-12-22 DIAGNOSIS — D0512 Intraductal carcinoma in situ of left breast: Secondary | ICD-10-CM

## 2016-12-22 NOTE — Telephone Encounter (Signed)
The patient has been advised of the Pre Op date and time and the Surgery date.  Surgery: 01/02/17 - Dr Adonis Huguenin  - Left Breast Lump, SLN Bx, Needle LOC  Pre Op: 12/26/16 at 10:30 am, Office appt.  Patient made aware to call [phone], between 1-3 PM the day before surgery to find out what time to arrive.   The patient will arrive at the [facility] on 01/02/17 at 8:15 am.  The patient is aware of dates, times, and instructions.

## 2016-12-26 ENCOUNTER — Inpatient Hospital Stay: Admission: RE | Admit: 2016-12-26 | Payer: Medicaid Other | Source: Ambulatory Visit

## 2016-12-28 ENCOUNTER — Ambulatory Visit
Admission: RE | Admit: 2016-12-28 | Discharge: 2016-12-28 | Disposition: A | Discharge status: Home / Self Care | Payer: Self-pay | Source: Ambulatory Visit | Attending: General Surgery | Admitting: General Surgery

## 2016-12-28 ENCOUNTER — Encounter
Admission: RE | Admit: 2016-12-28 | Discharge: 2016-12-28 | Disposition: A | Discharge status: Home / Self Care | Payer: Self-pay | Source: Ambulatory Visit | Attending: General Surgery | Admitting: General Surgery

## 2016-12-28 DIAGNOSIS — J302 Other seasonal allergic rhinitis: Secondary | ICD-10-CM | POA: Insufficient documentation

## 2016-12-28 DIAGNOSIS — R002 Palpitations: Secondary | ICD-10-CM | POA: Insufficient documentation

## 2016-12-28 DIAGNOSIS — Z01818 Encounter for other preprocedural examination: Secondary | ICD-10-CM | POA: Insufficient documentation

## 2016-12-28 DIAGNOSIS — D0512 Intraductal carcinoma in situ of left breast: Secondary | ICD-10-CM | POA: Insufficient documentation

## 2016-12-28 DIAGNOSIS — Z01810 Encounter for preprocedural cardiovascular examination: Secondary | ICD-10-CM

## 2016-12-28 DIAGNOSIS — G43909 Migraine, unspecified, not intractable, without status migrainosus: Secondary | ICD-10-CM | POA: Insufficient documentation

## 2016-12-28 HISTORY — DX: Anxiety disorder, unspecified: F41.9

## 2016-12-28 NOTE — Patient Instructions (Signed)
Your procedure is scheduled on: Monday 01/02/17 Su procedimiento est programado para: Report to [place] (8:30 AM) Presntese a:    Remember: Instructions that are not followed completely may result in serious medical risk, up to and including death, or upon the discretion of your surgeon and anesthesiologist your surgery may need to be rescheduled.  Recuerde: Las instrucciones que no se siguen completamente Heritage manager en un riesgo de salud grave, incluyendo hasta la [place] o a discrecin de su cirujano y Environmental health practitioner, su ciruga se puede posponer.   __X__ 1. Do not eat food  after midnight. No gum chewing or hard candies.  No coma alimentos despus de la medianoche.  No mastique chicle ni caramelos  [place]. Puede tomar liquidos transparentes como te, cafe o gatorade, jugo de [place], te o cafe sin Chief of Staff ni crema hasta 2 horas antes de la hora programada de llegada     __X__ 2. No alcohol for 24 hours before or after surgery.    No tome alcohol durante las 24 horas antes ni despus de la [place].   ____ 3. Bring all medications with you on the day of surgery if instructed.    Lleve todos los medicamentos con usted el da de su ciruga si se le ha indicado as.   __X__ 4. Notify your doctor if there is any change in your medical condition (cold, fever,                             infections).    Informe a su mdico si hay algn cambio en su condicin mdica (resfriado, fiebre, infecciones).   Do not wear jewelry, make-up, hairpins, clips or nail polish.  No use joyas, maquillajes, pinzas/ganchos para el cabello ni esmalte de uas.  Do not wear lotions, powders, or perfumes.  No use lociones, polvos o perfumes.      Do not shave 48 hours prior to surgery. Men may shave face and neck.  No se afeite 48 horas antes de la [place].  Los hombres pueden [employer] cara y el cuello.   Do not bring valuables to the hospital.   No lleve objetos [facility] is not responsible for any belongings or valuables.  [place] no se hace responsable de ningn tipo de pertenencias u objetos de Geographical information systems officer.               Contacts, dentures or bridgework may not be worn into surgery.  Los lentes de [place], las dentaduras postizas o puentes no se pueden usar en la [place].  Leave your suitcase in the car. After surgery it may be brought to your room.  Deje su maleta en el auto.  Despus de la ciruga podr traerla a su habitacin.  For patients admitted to the hospital, discharge time is determined by your treatment team.  Para los pacientes que sean ingresados al hospital, el tiempo en el cual se le dar de alta es determinado por su                equipo de [place].   Patients discharged the day of surgery will not be allowed to drive home. A los pacientes que se les da de alta el mismo da de la ciruga no se les permitir conducir a Holiday representative.   Please read over the following fact sheets that you were given: Por favor [place]  informacin que le dieron:   MRSA Information   ____ Take these medicines the morning of surgery with A SIP OF WATER:          [employer] estas medicinas la maana de la ciruga con UN SORBO DE AGUA:  1. NONE  2.   3.   4.       5.  6.  ____ Fleet Enema (as directed)          Enema de Fleet (segn lo indicado)    __X__ Use CHG Soap as directed          Utilice el jabn de CHG segn lo indicado  ____ Use inhalers on the day of surgery          Use los inhaladores el da de la ciruga  ____ Stop metformin 2 days prior to surgery          Deje de tomar el metformin 2 das antes de la ciruga    ____ Take 1/2 of usual insulin dose the night before surgery and none on the morning of surgery           Tome la mitad de la dosis habitual de insulina la noche antes de la [place] y no tome nada en la maana de la             ciruga  ____ Stop Coumadin/Plavix/aspirin on           Deje de  tomar el Coumadin/Plavix/aspirina el da:  ____ Stop Anti-inflammatories on           Deje de tomar antiinflamatorios el da:   ____ Stop supplements until after surgery            Deje de tomar suplementos hasta despus de la ciruga  ____ Bring C-Pap to the hospital          [place] al hospital

## 2017-01-01 MED ORDER — CEFAZOLIN SODIUM-DEXTROSE 2-4 GM/100ML-% IV SOLN: 2.0000 g | INTRAVENOUS | Status: DC

## 2017-01-02 ENCOUNTER — Ambulatory Visit: Payer: Self-pay | Admitting: Anesthesiology

## 2017-01-02 ENCOUNTER — Ambulatory Visit
Admission: RE | Admit: 2017-01-02 | Discharge: 2017-01-02 | Disposition: A | Discharge status: Home / Self Care | Payer: Self-pay | Source: Ambulatory Visit | Attending: General Surgery | Admitting: General Surgery

## 2017-01-02 ENCOUNTER — Encounter
Admission: RE | Disposition: A | Discharge status: Home / Self Care | Payer: Self-pay | Source: Ambulatory Visit | Attending: General Surgery

## 2017-01-02 ENCOUNTER — Encounter
Admission: RE | Admit: 2017-01-02 | Discharge: 2017-01-02 | Disposition: A | Discharge status: Home / Self Care | Payer: Self-pay | Source: Ambulatory Visit | Attending: General Surgery | Admitting: General Surgery

## 2017-01-02 ENCOUNTER — Encounter: Payer: Self-pay | Admitting: Emergency Medicine

## 2017-01-02 ENCOUNTER — Telehealth: Payer: Self-pay

## 2017-01-02 DIAGNOSIS — N6320 Unspecified lump in the left breast, unspecified quadrant: Secondary | ICD-10-CM

## 2017-01-02 DIAGNOSIS — D0512 Intraductal carcinoma in situ of left breast: Secondary | ICD-10-CM | POA: Insufficient documentation

## 2017-01-02 DIAGNOSIS — D242 Benign neoplasm of left breast: Secondary | ICD-10-CM | POA: Insufficient documentation

## 2017-01-02 HISTORY — PX: BREAST LUMPECTOMY: SHX2

## 2017-01-02 HISTORY — PX: BREAST LUMPECTOMY WITH SENTINEL LYMPH NODE BIOPSY: SHX5597

## 2017-01-02 LAB — POCT PREGNANCY, URINE: Preg Test, Ur: NEGATIVE

## 2017-01-02 SURGERY — BREAST LUMPECTOMY WITH SENTINEL LYMPH NODE BX
Anesthesia: General | Laterality: Left | Wound class: Clean

## 2017-01-02 MED ORDER — METHYLENE BLUE 0.5 % INJ SOLN
INTRAVENOUS | Status: DC | PRN
  Administered 2017-01-02: 5 mL via SUBMUCOSAL

## 2017-01-02 MED ORDER — LACTATED RINGERS IV SOLN
INTRAVENOUS | Status: DC
  Administered 2017-01-02 (×2): via INTRAVENOUS

## 2017-01-02 MED ORDER — FENTANYL CITRATE (PF) 100 MCG/2ML IJ SOLN
INTRAMUSCULAR | Status: AC
  Filled 2017-01-02: qty 2

## 2017-01-02 MED ORDER — PROPOFOL 10 MG/ML IV BOLUS
INTRAVENOUS | Status: DC | PRN
  Administered 2017-01-02: 150 mg via INTRAVENOUS

## 2017-01-02 MED ORDER — PROMETHAZINE HCL 25 MG/ML IJ SOLN
6.2500 mg | INTRAMUSCULAR | Status: DC | PRN
  Administered 2017-01-02: 12.5 mg via INTRAVENOUS

## 2017-01-02 MED ORDER — CHLORHEXIDINE GLUCONATE CLOTH 2 % EX PADS: 6.0000 | MEDICATED_PAD | Freq: Once | CUTANEOUS | Status: DC

## 2017-01-02 MED ORDER — LIDOCAINE HCL (PF) 2 % IJ SOLN
INTRAMUSCULAR | Status: AC
  Filled 2017-01-02: qty 2

## 2017-01-02 MED ORDER — OXYCODONE-ACETAMINOPHEN 5-325 MG PO TABS
ORAL_TABLET | ORAL | Status: AC
  Filled 2017-01-02: qty 1

## 2017-01-02 MED ORDER — FENTANYL CITRATE (PF) 100 MCG/2ML IJ SOLN
25.0000 ug | INTRAMUSCULAR | Status: DC | PRN
  Administered 2017-01-02 (×2): 50 ug via INTRAVENOUS

## 2017-01-02 MED ORDER — PROPOFOL 10 MG/ML IV BOLUS
INTRAVENOUS | Status: AC
  Filled 2017-01-02: qty 20

## 2017-01-02 MED ORDER — ONDANSETRON HCL 4 MG/2ML IJ SOLN
INTRAMUSCULAR | Status: DC | PRN
  Administered 2017-01-02: 4 mg via INTRAVENOUS

## 2017-01-02 MED ORDER — LIDOCAINE-EPINEPHRINE (PF) 1 %-1:200000 IJ SOLN
INTRAMUSCULAR | Status: AC
  Filled 2017-01-02: qty 30

## 2017-01-02 MED ORDER — OXYCODONE-ACETAMINOPHEN 5-325 MG PO TABS
1.0000 | ORAL_TABLET | ORAL | Status: DC | PRN
  Administered 2017-01-02: 1 via ORAL

## 2017-01-02 MED ORDER — EPHEDRINE SULFATE 50 MG/ML IJ SOLN
INTRAMUSCULAR | Status: AC
  Filled 2017-01-02: qty 1

## 2017-01-02 MED ORDER — EPHEDRINE SULFATE 50 MG/ML IJ SOLN
INTRAMUSCULAR | Status: DC | PRN
  Administered 2017-01-02 (×2): 5 mg via INTRAVENOUS

## 2017-01-02 MED ORDER — OXYCODONE HCL 5 MG PO TABS: 5.0000 mg | ORAL_TABLET | Freq: Once | ORAL | Status: DC | PRN

## 2017-01-02 MED ORDER — FAMOTIDINE 20 MG PO TABS
ORAL_TABLET | ORAL | Status: AC
  Filled 2017-01-02: qty 1

## 2017-01-02 MED ORDER — FENTANYL CITRATE (PF) 100 MCG/2ML IJ SOLN
INTRAMUSCULAR | Status: AC
  Administered 2017-01-02: 50 ug via INTRAVENOUS
  Filled 2017-01-02: qty 2

## 2017-01-02 MED ORDER — OXYCODONE HCL 5 MG/5ML PO SOLN: 5.0000 mg | Freq: Once | ORAL | Status: DC | PRN

## 2017-01-02 MED ORDER — ONDANSETRON HCL 4 MG/2ML IJ SOLN
INTRAMUSCULAR | Status: AC
  Filled 2017-01-02: qty 2

## 2017-01-02 MED ORDER — BUPIVACAINE HCL 0.5 % IJ SOLN
INTRAMUSCULAR | Status: DC | PRN
  Administered 2017-01-02: 10 mL

## 2017-01-02 MED ORDER — PROMETHAZINE HCL 25 MG/ML IJ SOLN
INTRAMUSCULAR | Status: AC
  Administered 2017-01-02: 12.5 mg via INTRAVENOUS
  Filled 2017-01-02: qty 1

## 2017-01-02 MED ORDER — ONDANSETRON 4 MG PO TBDP: 4.0000 mg | ORAL_TABLET | Freq: Three times a day (TID) | ORAL | 0 refills | Status: DC | PRN

## 2017-01-02 MED ORDER — SEVOFLURANE IN SOLN
RESPIRATORY_TRACT | Status: AC
  Filled 2017-01-02: qty 250

## 2017-01-02 MED ORDER — MIDAZOLAM HCL 2 MG/2ML IJ SOLN
INTRAMUSCULAR | Status: AC
  Filled 2017-01-02: qty 2

## 2017-01-02 MED ORDER — FAMOTIDINE 20 MG PO TABS
20.0000 mg | ORAL_TABLET | Freq: Once | ORAL | Status: AC
  Administered 2017-01-02: 20 mg via ORAL

## 2017-01-02 MED ORDER — LIDOCAINE-EPINEPHRINE (PF) 1 %-1:200000 IJ SOLN
INTRAMUSCULAR | Status: DC | PRN
  Administered 2017-01-02: 10 mL

## 2017-01-02 MED ORDER — OXYCODONE-ACETAMINOPHEN 5-325 MG PO TABS: 1.0000 | ORAL_TABLET | ORAL | 0 refills | Status: DC | PRN

## 2017-01-02 MED ORDER — LIDOCAINE HCL 2 % EX GEL
CUTANEOUS | Status: AC
  Filled 2017-01-02: qty 5

## 2017-01-02 MED ORDER — SODIUM CHLORIDE FLUSH 0.9 % IV SOLN
INTRAVENOUS | Status: AC
  Filled 2017-01-02: qty 10

## 2017-01-02 MED ORDER — MIDAZOLAM HCL 2 MG/2ML IJ SOLN
INTRAMUSCULAR | Status: DC | PRN
  Administered 2017-01-02: 2 mg via INTRAVENOUS

## 2017-01-02 MED ORDER — DEXAMETHASONE SODIUM PHOSPHATE 10 MG/ML IJ SOLN
INTRAMUSCULAR | Status: AC
  Filled 2017-01-02: qty 1

## 2017-01-02 MED ORDER — MEPERIDINE HCL 50 MG/ML IJ SOLN: 6.2500 mg | INTRAMUSCULAR | Status: DC | PRN

## 2017-01-02 MED ORDER — METHYLENE BLUE 0.5 % INJ SOLN
INTRAVENOUS | Status: AC
  Filled 2017-01-02: qty 10

## 2017-01-02 MED ORDER — GLYCOPYRROLATE 0.2 MG/ML IJ SOLN
INTRAMUSCULAR | Status: DC | PRN
  Administered 2017-01-02: .2 mg via INTRAVENOUS

## 2017-01-02 MED ORDER — LIDOCAINE HCL (CARDIAC) 20 MG/ML IV SOLN
INTRAVENOUS | Status: DC | PRN
  Administered 2017-01-02: 40 mg via INTRAVENOUS

## 2017-01-02 MED ORDER — TECHNETIUM TC 99M SULFUR COLLOID FILTERED
0.7160 | Freq: Once | INTRAVENOUS | Status: AC | PRN
  Administered 2017-01-02: 0.716 via INTRADERMAL

## 2017-01-02 MED ORDER — FENTANYL CITRATE (PF) 100 MCG/2ML IJ SOLN
INTRAMUSCULAR | Status: DC | PRN
  Administered 2017-01-02: 25 ug via INTRAVENOUS
  Administered 2017-01-02: 50 ug via INTRAVENOUS
  Administered 2017-01-02: 25 ug via INTRAVENOUS
  Administered 2017-01-02: 50 ug via INTRAVENOUS
  Administered 2017-01-02 (×2): 25 ug via INTRAVENOUS

## 2017-01-02 MED ORDER — CEFAZOLIN SODIUM-DEXTROSE 2-4 GM/100ML-% IV SOLN
INTRAVENOUS | Status: AC
  Filled 2017-01-02: qty 100

## 2017-01-02 MED ORDER — BUPIVACAINE HCL (PF) 0.5 % IJ SOLN
INTRAMUSCULAR | Status: AC
  Filled 2017-01-02: qty 30

## 2017-01-02 MED ORDER — DEXAMETHASONE SODIUM PHOSPHATE 10 MG/ML IJ SOLN
INTRAMUSCULAR | Status: DC | PRN
  Administered 2017-01-02: 10 mg via INTRAVENOUS

## 2017-01-02 SURGICAL SUPPLY — 34 items
BLADE SURG 15 STRL LF DISP TIS (BLADE) ×1 IMPLANT
BLADE SURG 15 STRL SS (BLADE) ×2
CANISTER SUCT 1200ML W/VALVE (MISCELLANEOUS) ×3 IMPLANT
CHLORAPREP W/TINT 26ML (MISCELLANEOUS) ×3 IMPLANT
CNTNR SPEC 2.5X3XGRAD LEK (MISCELLANEOUS) ×3
CONT SPEC 4OZ STER OR WHT (MISCELLANEOUS) ×6
CONTAINER SPEC 2.5X3XGRAD LEK (MISCELLANEOUS) ×3 IMPLANT
COVER PROBE FLX POLY STRL (MISCELLANEOUS) ×3 IMPLANT
DERMABOND ADVANCED (GAUZE/BANDAGES/DRESSINGS) ×2
DERMABOND ADVANCED .7 DNX12 (GAUZE/BANDAGES/DRESSINGS) ×1 IMPLANT
DRAPE LAPAROTOMY TRNSV 106X77 (MISCELLANEOUS) ×3 IMPLANT
ELECT CAUTERY BLADE 6.4 (BLADE) ×3 IMPLANT
ELECT REM PT RETURN 9FT ADLT (ELECTROSURGICAL) ×3
ELECTRODE REM PT RTRN 9FT ADLT (ELECTROSURGICAL) ×1 IMPLANT
GLOVE BIO SURGEON STRL SZ7.5 (GLOVE) ×3 IMPLANT
GLOVE INDICATOR 8.0 STRL GRN (GLOVE) ×3 IMPLANT
GOWN STRL REUS W/ TWL LRG LVL3 (GOWN DISPOSABLE) ×2 IMPLANT
GOWN STRL REUS W/TWL LRG LVL3 (GOWN DISPOSABLE) ×4
KIT RM TURNOVER STRD PROC AR (KITS) ×3 IMPLANT
LABEL OR SOLS (LABEL) ×3 IMPLANT
NDL SAFETY ECLIPSE 18X1.5 (NEEDLE) ×1 IMPLANT
NEEDLE HYPO 18GX1.5 SHARP (NEEDLE) ×2
NEEDLE HYPO 25X1 1.5 SAFETY (NEEDLE) ×6 IMPLANT
PACK BASIN MINOR ARMC (MISCELLANEOUS) ×3 IMPLANT
SLEVE PROBE SENORX GAMMA FIND (MISCELLANEOUS) ×3 IMPLANT
SUT MNCRL 4-0 (SUTURE) ×2
SUT MNCRL 4-0 27XMFL (SUTURE) ×1
SUT SILK 2 0 SH (SUTURE) ×3 IMPLANT
SUT VIC AB 3-0 SH 27 (SUTURE) ×4
SUT VIC AB 3-0 SH 27X BRD (SUTURE) ×2 IMPLANT
SUTURE MNCRL 4-0 27XMF (SUTURE) ×1 IMPLANT
SYR BULB EAR ULCER 3OZ GRN STR (SYRINGE) ×3 IMPLANT
SYRINGE 10CC LL (SYRINGE) ×6 IMPLANT
WATER STERILE IRR 1000ML POUR (IV SOLUTION) ×3 IMPLANT

## 2017-01-02 NOTE — Anesthesia Post-op Follow-up Note (Signed)
Anesthesia QCDR form completed.        

## 2017-01-02 NOTE — Progress Notes (Signed)
Pot states still painful but does want to go home

## 2017-01-02 NOTE — Anesthesia Procedure Notes (Signed)
Procedure Name: LMA Insertion Date/Time: 01/02/2017 11:20 AM Performed by: Allean Found Pre-anesthesia Checklist: Patient identified, Emergency Drugs available, Suction available, Patient being monitored and Timeout performed Patient Re-evaluated:Patient Re-evaluated prior to induction Oxygen Delivery Method: Circle system utilized Preoxygenation: Pre-oxygenation with 100% oxygen Induction Type: IV induction Ventilation: Mask ventilation without difficulty LMA: LMA inserted LMA Size: 4.0 Number of attempts: 1 Placement Confirmation: positive ETCO2 Tube secured with: Tape Dental Injury: Teeth and Oropharynx as per pre-operative assessment

## 2017-01-02 NOTE — Telephone Encounter (Signed)
error 

## 2017-01-02 NOTE — Transfer of Care (Signed)
Immediate Anesthesia Transfer of Care Note  Patient: Sherry Short  Procedure(s) Performed: Procedure(s): BREAST LUMPECTOMY WITH SENTINEL LYMPH NODE BX (Left)  Patient Location: PACU  Anesthesia Type:General  Level of Consciousness: sedated  Airway & Oxygen Therapy: Patient Spontanous Breathing and Patient connected to face mask oxygen  Post-op Assessment: Report given to RN and Post -op [place] signs reviewed and stable  Post Scripter signs: Reviewed and stable  Last Vitals:  Vitals:   01/02/17 1035 01/02/17 1346  BP: 117/69   Pulse: 76 85  Resp: 18 13  Temp: 36.7 C 36.6 C  SpO2: 100% 100%    Last Pain:  Vitals:   01/02/17 1035  TempSrc: Oral  PainSc: 0-No pain         Complications: No apparent anesthesia complications

## 2017-01-02 NOTE — Discharge Instructions (Signed)
Mastectoma parcial con o sin extirpacin de ganglios linfticos axilares, cuidados posteriores (Partial Mastectomy With or Without Axillary Lymph Node Removal, Care After) Siga estas instrucciones durante las prximas semanas. Estas indicaciones le proporcionan informacin acerca de cmo deber cuidarse despus del procedimiento. El mdico tambin podr darle instrucciones ms especficas. El tratamiento ha sido planificado segn las prcticas mdicas actuales, pero en algunos casos pueden ocurrir problemas. Comunquese con el mdico si tiene algn problema o tiene dudas despus del procedimiento. QU ESPERAR DESPUS DEL PROCEDIMIENTO Despus del procedimiento, es comn tener los siguientes sntomas:  [employer].  Dolor a Radiographer, therapeutic.  Rigidez en el brazo o el hombro.  Cambio en la forma y la sensibilidad de las [place]. Alleman baos con esponja hasta que el mdico la autorice a Control and instrumentation engineer o a Teacher, early years/pre de inmersin.  No tome baos de inmersin, no nade ni use el jacuzzi hasta que el mdico la autorice. Cuidado de la incisin  Hay muchas maneras distintas de cerrar y cubrir una incisin, como puntos, pegamento para la piel y Sonny Masters. Siga las instrucciones del mdico con respecto a lo siguiente: ? Careers adviser herida. ? Cambiar y Music therapist venda (vendaje). ? Quitar el cierre de la incisin.  Shepherd zona de la incisin para detectar signos de infeccin. Est atenta a lo siguiente: ? Dolor, hinchazn o enrojecimiento. ? Lquido, sangre o pus.  Si la enviaron de regreso a su casa con un drenaje quirrgico colocado, siga las instrucciones del mdico para vaciarlo. Actividades  Reanude sus actividades normales como se lo haya indicado el mdico.  Evite el ejercicio fsico intenso.  Evite cualquier actividad que pueda causarle una lesin en el brazo que est del lado de la [place].  No  levante ningn objeto que pese ms de 10libras (4,5kg). Evite levantar objetos con el brazo que est del lado de la [place].  No cargue objetos pesados [employer].  Despus de que le saquen el drenaje, debe realizar ejercicios para evitar que el brazo se entumezca e hinche. Consulte al [employer] tipos de ejercicios que son seguros para usted. Instrucciones generales  [employer] solamente como se lo haya indicado el mdico.  Mantenga el vendaje limpio y seco.  Puede seguir su dieta habitual.  Use un sostn de soporte como se lo haya indicado su mdico.  Mantenga el brazo elevado al descansar.  No use anillos, pulseras ni otros accesorios ajustados en el brazo, la [place] o los dedos del lado de la [place].  Realcese controles para detectar exceso de lquido alrededor de los ganglios linfticos (linfedema) con la frecuencia que le haya indicado el mdico.  Si durante el procedimiento le extrajeron algn ganglio linftico, asegrese de decrselo a todos sus mdicos. Esta es una informacin importante que debe proporcionar antes de ciertos procedimientos, como una extraccin de sangre para donar o la medicin de la presin arterial. SOLICITE ATENCIN MDICA SI:  Jaclynn Guarneri.  El medicamento para Glass blower/designer no le hace efecto.  La hinchazn, la debilidad o el entumecimiento del brazo no han mejorado despus de unas semanas.  Tiene una hinchazn nueva en la mama o el brazo.  Tiene enrojecimiento, hinchazn o dolor en la zona de la incisin.  Observa lquido, sangre o pus que emanan de la incisin. SOLICITE ATENCIN MDICA DE INMEDIATO SI:  Tiene un dolor muy intenso en la mama  o el brazo.  Siente dolor en el pecho.  Tiene dificultad para respirar. Esta informacin no tiene Marine scientist el consejo del mdico. Asegrese de hacerle al mdico cualquier pregunta que tenga. Document Released: 05/09/2006 Document Revised: 07/20/2015 Document  Reviewed: 12/11/2013 Elsevier Interactive Patient Education  2018 [employer].

## 2017-01-02 NOTE — Anesthesia Postprocedure Evaluation (Signed)
Anesthesia Post Note  Patient: Thais Silberstein Mccullars  Procedure(s) Performed: Procedure(s) (LRB): BREAST LUMPECTOMY WITH SENTINEL LYMPH NODE BX (Left)  Patient location during evaluation: PACU Anesthesia Type: General Level of consciousness: awake and alert and oriented Pain management: pain level controlled [place] Signs Assessment: post-procedure [place] signs reviewed and stable Respiratory status: spontaneous breathing, nonlabored ventilation and respiratory function stable Cardiovascular status: blood pressure returned to baseline and stable Postop Assessment: no signs of nausea or vomiting Anesthetic complications: no     Last Vitals:  Vitals:   01/02/17 1446 01/02/17 1457  BP: 107/73 105/69  Pulse: 95 88  Resp: 10 18  Temp: (!) 36.2 C (!) 36.2 C  SpO2: 97% 100%    Last Pain:  Vitals:   01/02/17 1457  TempSrc: Temporal  PainSc: 3                  Osaze Hubbert

## 2017-01-02 NOTE — Anesthesia Preprocedure Evaluation (Signed)
Anesthesia Evaluation  Patient identified by MRN, date of birth, ID band Patient awake    Reviewed: Allergy & Precautions, NPO status , Patient's Chart, lab work & pertinent test results  History of Anesthesia Complications Negative for: history of anesthetic complications  Airway Mallampati: II  TM Distance: >3 FB Neck ROM: Full    Dental no notable dental hx.    Pulmonary neg pulmonary ROS, neg sleep apnea, neg COPD,    breath sounds clear to auscultation- rhonchi (-) wheezing      Cardiovascular Exercise Tolerance: Good (-) hypertension(-) CAD, (-) Past MI and (-) Cardiac Stents  Rhythm:Regular Rate:Normal - Systolic murmurs and - Diastolic murmurs    Neuro/Psych  Headaches, Anxiety    GI/Hepatic negative GI ROS, Neg liver ROS,   Endo/Other  negative endocrine ROSneg diabetes  Renal/GU negative Renal ROS     Musculoskeletal negative musculoskeletal ROS (+)   Abdominal (+) - obese,   Peds  Hematology negative hematology ROS (+)   Anesthesia Other Findings Past Medical History: No date: Anxiety 12/13/2016: Cancer ([place])     Comment:  Left Breast (DCIS) No date: Migraine headache No date: Seasonal allergies   Reproductive/Obstetrics                             Anesthesia Physical Anesthesia Plan  ASA: II  Anesthesia Plan: General   Post-op Pain Management:    Induction: Intravenous  PONV Risk Score and Plan: 2 and Ondansetron and Dexamethasone  Airway Management Planned: LMA  Additional Equipment:   Intra-op Plan:   Post-operative Plan:   Informed Consent: I have reviewed the patients History and Physical, chart, labs and discussed the procedure including the risks, benefits and alternatives for the proposed anesthesia with the patient or authorized representative who has indicated his/her understanding and acceptance.   Dental advisory given  Plan Discussed with: CRNA  and Anesthesiologist  Anesthesia Plan Comments:         Anesthesia Quick Evaluation

## 2017-01-02 NOTE — H&P (View-Only) (Signed)
Patient ID: Jakira Mcfadden Reigle, female   DOB: 04/07/1976, 41 y.o.   MRN: [id number]  CC: left breast DCIS  HPI Saraphina Lauderbaugh Schildt is a 41 y.o. female presents to clinic today for evaluation of left breast DCIS. Patient has a significant family history of a sister who was diagnosed with breast cancer at the age of 54. She started having mammography at age 59 that were all negative up until this year. She reports prior to the mammography she actually felt the lump which caused her to call and schedule a mammogram. She's never had a biopsy done before. However with this lump it was found to be suspicious and a biopsy returned a result of DCIS. She's had 5 pregnancies with 4 live births. She denies having breast-fed or using any hormone supplementation. She denies tobacco use as well. Currently she denies any fevers, chills, nausea, vomiting, shortness breath, chest pain, diarrhea, constipation. She does have some tenderness to the left breast and left arm. She is otherwise in her usual state of health.  HPI  Past Medical History:  Diagnosis Date  . Cancer ([place]) 12/13/2016   Left Breast (DCIS)  . Migraine headache   . Seasonal allergies     Past Surgical History:  Procedure Laterality Date  . BREAST BIOPSY Left 11/30/2016   left breast calcs path pending  . BREAST BIOPSY Left 12/13/2016   Left Affrim Bx- Path pending  . BREAST BIOPSY Left 12/13/2016   Left Affirm Bx- Path pending    Family History  Problem Relation Age of Onset  . Breast cancer Sister 79  . Healthy Mother   . Diabetes Father     Social History Social History  Substance Use Topics  . Smoking status: Never Smoker  . Smokeless tobacco: Never Used  . Alcohol use No    No Known Allergies  Current Outpatient Prescriptions  Medication Sig Dispense Refill  . acetaminophen (TYLENOL) 325 MG tablet Take 650 mg by mouth every 6 (six) hours as needed.     No current facility-administered medications for this visit.       Review of Systems A Multi-point review of systems was asked and was negative except for the findings documented in the history of present illness  Physical Exam Blood pressure 126/80, pulse 72, temperature 98.3 F (36.8 C), temperature source Oral, height 4\' 6"  (1.372 m), weight 62.1 kg (137 lb), last menstrual period 12/05/2016. CONSTITUTIONAL: No acute distress. EYES: Pupils are equal, round, and reactive to light, Sclera are non-icteric. EARS, NOSE, MOUTH AND THROAT: The oropharynx is clear. The oral mucosa is pink and moist. Hearing is intact to voice. BREAST: Bilateral breasts were examined. Right breast without any pathologic finding, lump, bump. Normal tissue. Left breast with a palpable, tender area with Steri-Strips still attached at approximately the 1:00 position 5 cm from the nipple areolar complex. Remainder of the breast is normal on exam. LYMPH NODES:  Lymph nodes in the neck and bilateral axilla are normal. RESPIRATORY:  Lungs are clear. There is normal respiratory effort, with equal breath sounds bilaterally, and without pathologic use of accessory muscles. CARDIOVASCULAR: Heart is regular without murmurs, gallops, or rubs. GI: The abdomen is soft, nontender, and nondistended. There are no palpable masses. There is no hepatosplenomegaly. There are normal bowel sounds in all quadrants. GU: Rectal deferred.   MUSCULOSKELETAL: Normal muscle strength and tone. No cyanosis or edema.   SKIN: Turgor is good and there are no pathologic skin lesions or ulcers. NEUROLOGIC: Motor  and sensation is grossly normal. Cranial nerves are grossly intact. PSYCH:  Oriented to person, place and time. Affect is normal.  Data Reviewed Images and labs reviewed. Images show a 2.6 x 1.7 x 2.8 cm hypoechoic mass in the area of palpable abnormality with some calcifications as well. Stereotactic biopsy returned DCIS. I have personally reviewed the patient's imaging, laboratory findings and medical  records.    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 Discussed the surgical options of lumpectomy with sentinel lymph node biopsy versus mastectomy with sentinel lymph node biopsy. Discussed the risks, benefits, alternatives of each. After this discussion patient states that she'll prefer to have a lumpectomy and voiced understanding that for her to be equal to mastectomy and has to be followed by radiation therapy as well. The procedure was described in detail to include the risks of pain, bleeding, infection, possible need to return to the operating room for more tissue, possible damage to the axillary nerves. She voiced understanding and desires to proceed. We will plan to proceed to the operating room on Monday, September 24.     Time spent with the patient was 45 minutes, with more than 50% of the time spent in face-to-face education, counseling and care coordination.     Clayburn Pert, MD FACS General Surgeon 12/21/2016, 2:18 PM

## 2017-01-02 NOTE — Interval H&P Note (Signed)
History and Physical Interval Note:  01/02/2017 10:36 AM  Sherry Short  has presented today for surgery, with the diagnosis of DUCTAL CARCINOMA SITU OF LEFT BREAST  The various methods of treatment have been discussed with the patient and family. After consideration of risks, benefits and other options for treatment, the patient has consented to  Procedure(s): BREAST LUMPECTOMY WITH SENTINEL LYMPH NODE BX (Left) as a surgical intervention .  The patient's history has been reviewed, patient examined, no change in status, stable for surgery.  I have reviewed the patient's chart and labs.  Questions were answered to the patient's satisfaction.     Clayburn Pert

## 2017-01-02 NOTE — Op Note (Signed)
Pre-operative Diagnosis: Left breast DCIS  Post-operative Diagnosis: Same  Procedure performed: Left axillary sentinel lymph node biopsy, left upper outer quadrant palpable breast mass excision, left breast needle localized retroareolar mass excision  Surgeon: Clayburn Pert   Assistants: None  Anesthesia: General LMA anesthesia  ASA Class: 2  Surgeon: Clayburn Pert, MD FACS  Anesthesia: Gen. with LMA tube  Assistant: None  Procedure Details  The patient was seen again in the Holding Room. The benefits, complications, treatment options, and expected outcomes were discussed with the patient. The risks of bleeding, infection, recurrence of symptoms, failure to completely remove cancer,  nerve injury, any of which could require further surgery were reviewed with the patient.   The patient was taken to Operating Room, identified as Sherry Short and the procedure verified.  A Time Out was held and the above information confirmed.  Prior to the induction of general anesthesia, antibiotic prophylaxis was administered. VTE prophylaxis was in place. General LMA anesthesia was then administered and tolerated well. After the induction, a pre-timeout was performed and methylene blue was injected into the left breast with 2 mL instilled into each quadrant after which the left breast and axilla was prepped with Chloraprep and draped in the sterile fashion. The patient was positioned in the supine position.  The procedure began with a left axilla sentinel lymph node biopsy. Using the gamma probe the incision was planned and localized with a 50-50 mixture 1% lidocaine and 0.5% Marcaine with epinephrine. The skin was incised with 15 blade scalpel and following the gamma probe the dissection was carried down into the axilla. A blue lymph node and blue lymphatic right identified. These were noted to be hot on the gamma probe. Using a 2-0 silk suture as a handle the lymph nodes were elevated and  excised with electrocautery. The first lymph node had a maximum count of 5300, the second lymph node had a maximal count of 2200. After which there was no residual radioactive counts within the axilla. There were no additional blue lymph nodes seen or visualized either.  We then turned our attention to the palpable breast mass on the upper outer aspect of the left breast. It was very close to her axillary incision. Attempts were made to remove it through the axillary incision without enlarging. This was unsuccessful and the incision was re-localized medially and incised with a 15 blade scalpel which allowed retractors to then clearly visualized the palpable mass. It was circumferentially excised with electrocautery and passed off the field for permanent specimen. Hemostasis was insured and a Ray-Tec gauze was placed into the axillary incision.   We then turned our attention to the retroareolar needle localized mass. A subareolar semicircular incision was planned and localized first with the aforementioned local anesthetic. The skin was incised with 15 blade scalpel and using Bovie electrocautery was taken down until the wire could be brought into the field. The wire was grasped and the area was circumferentially dissected out with electrocautery and marked with marking clips prior to being passed off the field. It was then taken to radiology and the wire and area of concern were confirmed within the specimen.  Both areas were irrigated with water and meticulous hemostasis was ensured. There were re-localized before mentioned local anesthetic. Both areas were closed in layers with deep interrupted 3-0 Vicryl and running subcuticular 4-0 Monocryl. They were then sealed with Dermabond. The patient tolerated the procedure well. There were no immediate consultations. All counts are correct at  the end of the procedure.  Findings: Left axillary sentinel lymph nodes 2, palpable left upper outer quadrant breast  mass, needle localized subareolar breast mass.   Estimated Blood Loss: 30 mL         Drains: None         Specimens: Left axillary sentinel lymph nodes 2, palpable left upper outer quadrant breast mass, needle localized subareolar breast mass.          Complications: None                  Condition: Good   Clayburn Pert, MD, FACS

## 2017-01-02 NOTE — Brief Op Note (Signed)
01/02/2017  1:40 PM  PATIENT:  Sherry Short  41 y.o. female  PRE-OPERATIVE DIAGNOSIS:  DUCTAL CARCINOMA SITU OF LEFT BREAST  POST-OPERATIVE DIAGNOSIS:  DUCTAL CARCINOMA SITU OF LEFT BREAST  PROCEDURE:  Procedure(s): BREAST LUMPECTOMY WITH SENTINEL LYMPH NODE BX (Left)  SURGEON:  Surgeon(s) and Role:    Clayburn Pert, MD - Primary  PHYSICIAN ASSISTANT:   ASSISTANTS: none   ANESTHESIA:   general  EBL:  Total I/O In: 600 [I.V.:600] Out: 5 [Blood:5]  BLOOD ADMINISTERED:none  DRAINS: none   LOCAL MEDICATIONS USED:  MARCAINE   , XYLOCAINE  and Amount: 20 ml  SPECIMEN:  Source of Specimen:  Left axillary sentinel node 2, left upper outer palpable mass, needle localized retroareolar mass  DISPOSITION OF SPECIMEN:  PATHOLOGY  COUNTS:  YES  TOURNIQUET:  * No tourniquets in log *  DICTATION: .Dragon Dictation  PLAN OF CARE: Discharge to home after PACU  PATIENT DISPOSITION:  PACU - hemodynamically stable.   Delay start of Pharmacological VTE agent (>24hrs) due to surgical blood loss or risk of bleeding: no

## 2017-01-03 ENCOUNTER — Encounter: Payer: Self-pay | Admitting: General Surgery

## 2017-01-05 ENCOUNTER — Telehealth: Payer: Self-pay

## 2017-01-05 LAB — SURGICAL PATHOLOGY

## 2017-01-05 NOTE — Telephone Encounter (Signed)
Called patient to let her know that her Margins were negative per Dr. Adonis Huguenin. Patient was excited to hear the news. I also asked her how she was feeling.  Post-op call made to patient at this time. Spoke with patient. Post-op interview questions below.  1. How are you feeling? With some pain on left breast.  2. Is your pain controlled? Yes with the pain medications that were provided.  3. What are you doing for the pain? Taking the pain medications.  4. Are you having any Nausea or Vomiting? no  5. Are you having any Fever or Chills? no  6. Are you having any Constipation or Diarrhea? no  7. Is there any Swelling or Bruising you are concerned about? She stated that she had some swelling on her left breast and heaviness.  8. Do you have any questions or concerns at this time? She wanted to know what to do for the pain.   Discussion: Recommended for her to apply her bra at all times and that the only time she should remove it is when she takes a shower. I also recommended for her to apply ice to the area and to take Ibuprofen 800 MG every 8 hours to help her with the inflammation. She stated that nothing like this was instructed at discharge from the hospital. I apologized and explained the importance of this. She understood and stated that she will put on her bra and not remove it. I also told her to sleep with it. Patient understood. I told her to give me a call if she had any questions or concerns. Patient agreed.

## 2017-01-05 NOTE — Telephone Encounter (Signed)
Called patient and had to leave her a voicemail to call me back. 

## 2017-01-06 NOTE — Progress Notes (Signed)
  Oncology Nurse Navigator Documentation  Navigator Location: [facility] (01/06/17 1500)   )Navigator Encounter Type: Telephone (01/06/17 1500)                         Barriers/Navigation Needs: Financial (01/06/17 1500)                          Time Spent with Patient: 30 (01/06/17 1500)   Phoned patient with Sherry Short interpreter to discuss Financial Advocate's recommendation to apply for Medicaid prior to turning in request for [place] assistance.  Given name and phone number of DSS caseworker.  Patient states she will contact DSS on 01/09/17.

## 2017-01-08 NOTE — Progress Notes (Signed)
[name]  [phone] [phone]) [phone]  ID: Sherry Short OB: September 12, 1975  MR#: [id number]  OQH#:[id number]  Patient Care Team: Patient, No Pcp Per as PCP - General (General Practice)  CHIEF COMPLAINT: Left breast DCIS.  INTERVAL HISTORY: Patient returns to clinic today for further evaluation and additional treatment planning. She recently had her lumpectomy on January 02, 2017 and tolerated the procedure well. She currently feels well and is asymptomatic. She has no neurologic complaints. She denies any recent fevers or illnesses. She has a good appetite and denies weight loss. She denies any pain. She has no chest pain or shortness of breath. She denies any nausea, vomiting, constipation, or diarrhea. She has no urinary complaints. Patient offers no specific complaints today.  REVIEW OF SYSTEMS:   Review of Systems  Constitutional: Negative.  Negative for fever, malaise/fatigue and weight loss.  Respiratory: Negative.  Negative for cough and shortness of breath.   Cardiovascular: Negative.  Negative for chest pain and leg swelling.  Gastrointestinal: Negative.  Negative for abdominal pain.  Genitourinary: Negative.  Negative for dysuria.  Musculoskeletal: Negative.   Skin: Negative.  Negative for rash.  Neurological: Negative.  Negative for sensory change and weakness.  Psychiatric/Behavioral: Negative.  The patient is not nervous/anxious.     As per HPI. Otherwise, a complete review of systems is negative.  PAST MEDICAL HISTORY: Past Medical History:  Diagnosis Date  . Anxiety   . Cancer ([place]) 12/13/2016   Left Breast (DCIS)  . Migraine headache   . Seasonal allergies     PAST SURGICAL HISTORY: Past Surgical History:  Procedure Laterality Date  . BREAST BIOPSY Left 11/30/2016   left breast calcs path pending  . BREAST BIOPSY Left 12/13/2016   Left Affrim Bx- Path pending  . BREAST BIOPSY Left 12/13/2016   Left Affirm Bx- Path  pending  . BREAST LUMPECTOMY Left 01/02/2017   Needle localization and lymph node disection  . BREAST LUMPECTOMY WITH SENTINEL LYMPH NODE BIOPSY Left 01/02/2017   Procedure: BREAST LUMPECTOMY WITH SENTINEL LYMPH NODE BX;  Surgeon: Clayburn Pert, MD;  Location: [facility] ORS;  Service: General;  Laterality: Left;  . TUBAL LIGATION      FAMILY HISTORY: Family History  Problem Relation Age of Onset  . Breast cancer Sister 28  . Healthy Mother   . Diabetes Father     ADVANCED DIRECTIVES (Y/N):  N  HEALTH MAINTENANCE: Social History  Substance Use Topics  . Smoking status: Never Smoker  . Smokeless tobacco: Never Used  . Alcohol use No     Colonoscopy:  PAP:  Bone density:  Lipid panel:  No Known Allergies  Current Outpatient Prescriptions  Medication Sig Dispense Refill  . acetaminophen (TYLENOL) 325 MG tablet Take 650 mg by mouth every 6 (six) hours as needed for mild pain or moderate pain.     Marland Kitchen oxyCODONE-acetaminophen (ROXICET) 5-325 MG tablet Take 1-2 tablets by mouth every 4 (four) hours as needed. 30 tablet 0  . ondansetron (ZOFRAN ODT) 4 MG disintegrating tablet Take 1 tablet (4 mg total) by mouth every 8 (eight) hours as needed for nausea or vomiting. (Patient not taking: Reported on 01/09/2017) 20 tablet 0   No current facility-administered medications for this visit.     OBJECTIVE: Vitals:   01/09/17 1139  BP: 107/76  Resp: 18  Temp: (!) 97 F (36.1 C)     Body mass index is 25.68 kg/m.    ECOG FS:0 - Asymptomatic  General:  Well-developed, well-nourished, no acute distress. Eyes: Pink conjunctiva, anicteric sclera. Breasts: Left breast with well-healed surgical scar.  Lungs: Clear to auscultation bilaterally. Heart: Regular rate and rhythm. No rubs, murmurs, or gallops. Abdomen: Soft, nontender, nondistended. No organomegaly noted, normoactive bowel sounds. Musculoskeletal: No edema, cyanosis, or clubbing. Neuro: Alert, answering all questions  appropriately. Cranial nerves grossly intact. Skin: No rashes or petechiae noted. Psych: Normal affect.   LAB RESULTS:  Lab Results  Component Value Date   NA 139 08/09/2015   K 4.1 08/09/2015   CL 110 08/09/2015   CO2 22 08/09/2015   GLUCOSE 98 08/09/2015   BUN 16 08/09/2015   CREATININE 1.02 (H) 08/09/2015   CALCIUM 9.2 08/09/2015   PROT 7.5 08/09/2015   ALBUMIN 4.3 08/09/2015   AST 18 08/09/2015   ALT 13 (L) 08/09/2015   ALKPHOS 68 08/09/2015   BILITOT 0.7 08/09/2015   GFRNONAA >60 08/09/2015   GFRAA >60 08/09/2015    Lab Results  Component Value Date   WBC 11.5 (H) 08/09/2015   HGB 13.6 08/09/2015   HCT 40.2 08/09/2015   MCV 87.2 08/09/2015   PLT 274 08/09/2015     STUDIES: Chest 2 View  Result Date: 12/28/2016 CLINICAL DATA:  Pre-op exam. lmp 3 days ago. Discomfort, sob, nausea, nonsmoker. EXAM: CHEST  2 VIEW COMPARISON:  Chest x-ray dated 03/27/2015. FINDINGS: The heart size and mediastinal contours are within normal limits. Both lungs are clear. The visualized skeletal structures are unremarkable. IMPRESSION: No active cardiopulmonary disease. Electronically Signed   By: Franki Cabot M.D.   On: 12/28/2016 19:40   Nm Sentinel Node Injection  Result Date: 01/02/2017 CLINICAL DATA:  Left breast cancer. EXAM: NUCLEAR MEDICINE BREAST LYMPHOSCINTIGRAPHY left TECHNIQUE: Intradermal injection of radiopharmaceutical was performed at the 12 o'clock, 3 o'clock, 6 o'clock, and 9 o'clock positions around the left nipple. The patient was then sent to the operating room where the sentinel node(s) were identified and removed by the surgeon. RADIOPHARMACEUTICALS:  Total of 1 mCi Millipore-filtered Technetium-27msulfur colloid, injected in four aliquots of 0.25 mCi each. IMPRESSION: Uncomplicated intradermal injection of a total of 1 mCi Technetium-964mulfur colloid for purposes of sentinel node identification. Electronically Signed   By: David  JoMartinique.D.   On: 01/02/2017 11:05     Mm Breast Surgical Specimen  Result Date: 01/02/2017 CLINICAL DATA:  Left lumpectomy for ductal carcinoma in situ. EXAM: SPECIMEN RADIOGRAPH OF THE LEFT BREAST COMPARISON:  Previous exam(s). FINDINGS: Status post excision of the left breast. The wire tip and ribbon shaped biopsy marker clip are present. IMPRESSION: Specimen radiograph of the left breast. Electronically Signed   By: StClaudie Revering.D.   On: 01/02/2017 13:21   Mm Clip Placement Left  Result Date: 12/13/2016 CLINICAL DATA:  Two stereotactic biopsies of the left breast were performed today. These include calcifications targeted in the lower inner quadrant of the left breast and calcifications in the central left breast. 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 EXAM: DIAGNOSTIC LEFT MAMMOGRAM POST STEREOTACTIC BIOPSIES COMPARISON:  Previous exam(s). FINDINGS: Mammographic images were obtained following stereotactic guided biopsies of the lower inner quadrant of the left breast and the central left breast. A coil shaped biopsy clip is present along the anterior aspect of the biopsied calcifications in the lower inner quadrant of the left breast. Please note that this biopsy clip is approximately 4 cm posterior, inferior, and medial to  the ribbon shaped biopsy clip at the site of the recently diagnosed ductal carcinoma in situ in the retroareolar left breast. An X shaped biopsy clip was placed during the second biopsy of calcifications in the central left breast. This X shaped biopsy clip migrated superiorly. It is positioned approximately 3.5 cm superior to the calcifications in the central left breast and should not be used for targeting purposes. There are still visible calcifications within this small group that could be targeted if necessary. IMPRESSION: 1. Satisfactory position of a coil shaped biopsy clip along the anterior  aspect of the calcifications in the lower inner quadrant of the left breast. 2. The X shaped biopsy clip migrated 3.5 cm superior to the small group of calcifications which were targeted for biopsy in the central left breast. At least some of these calcifications remain visible and could be targeted as needed. Final Assessment: Post Procedure Mammograms for Marker Placement Electronically Signed   By: Curlene Dolphin M.D.   On: 12/13/2016 14:47   Mm Lt Breast Bx W Loc Dev 1st Lesion Image Bx Spec Stereo Guide  Addendum Date: 12/16/2016   ADDENDUM REPORT: 12/16/2016 10:27 ADDENDUM: Pathology of the left breast biopsy lower inner quadrant revealed A. BREAST, LEFT, LOWER INNER QUADRANT, INFERIOR; STEREOTACTIC-GUIDED CORE BIOPSY: ONE LOBULE WITH COLUMNAR CELL CHANGE, SEE COMMENT. Comment: In A and B, no calcifications large enough for mammographic detection are seen. These samples are negative for atypia and malignancy, but correlation with post-procedure mammograms is needed to be sure the areas of concern are represented. This was found to be discordant by Dr. Radford Pax. Recommendation: Re-biopsy. Results and recommendations were relayed to Tanya Nones, RN, nurse navigator for [facility] on 12/16/16. This will be discussed with Dr. Grayland Ormond whom the patient saw on 12/15/16. The patient will be contacted by Tanya Nones, RN with the hospital interpreter to manage further care. Addendum by Jetta Lout, RRA on 12/16/16. Electronically Signed   By: Curlene Dolphin M.D.   On: 12/16/2016 10:27   Result Date: 12/16/2016 CLINICAL DATA:  Left breast stereotactic biopsy was recommended of 2 additional areas in the left breast, including the lower inner quadrant, and the central left breast. The patient has a recent diagnosis of ductal carcinoma in situ following a biopsy of calcifications in the retroareolar left breast. She was also diagnosed with a fibroepithelial lesion in the upper-outer quadrant of the  left breast for which excision was recommended. A Spanish interpreter was present throughout the procedures today. This report describes the biopsy targeting the calcifications in the lower inner quadrant. EXAM: LEFT BREAST STEREOTACTIC CORE NEEDLE BIOPSY COMPARISON:  Previous exams. FINDINGS: The patient and I discussed the procedure of stereotactic-guided biopsy including benefits and alternatives. We discussed the high likelihood of a successful procedure. We discussed the risks of the procedure including infection, bleeding, tissue injury, clip migration, and inadequate sampling. Informed written consent was given. The usual time out protocol was performed immediately prior to the procedure. Using sterile technique and 1% Lidocaine as local anesthetic, under stereotactic guidance, a 9 gauge vacuum assisted device was used to perform core needle biopsy of calcifications in the lower inner quadrant of the left breast using a medial to lateral approach. Specimen radiograph was performed showing a few faint calcifications. Specimens with calcifications are identified for pathology. Lesion quadrant: Lower inner quadrant At the conclusion of the procedure, a coil shaped tissue marker clip was deployed into the biopsy cavity. Follow-up 2-view mammogram was performed and dictated  separately. IMPRESSION: Stereotactic-guided biopsy of the lower inner quadrant of the left breast. No apparent complications. Electronically Signed: By: Curlene Dolphin M.D. On: 12/13/2016 14:11   Mm Lt Breast Bx W Loc Dev Ea Ad Lesion Img Bx Spec Stereo Guide  Addendum Date: 12/16/2016   ADDENDUM REPORT: 12/16/2016 10:30 ADDENDUM: Pathology of the left breast biopsy, central breast revealed B. BREAST, LEFT, CENTRAL; STEREOTACTIC-GUIDED CORE BIOPSY: FOCAL USUAL DUCTAL HYPERPLASIA AND COLUMNAR CELL CHANGE, SEE COMMENT. Comment: In A and B, no calcifications large enough for mammographic detection are seen. These samples are negative for atypia  and malignancy, but correlation with post-procedure mammograms is needed to be sure the areas of concern are represented. This was found to be discordant by Dr. Radford Pax.  Recommend re-biopsy. Results and recommendations were relayed to Tanya Nones, RN, nurse navigator for [facility]. She will discuss the results and recommendations with Dr. Grayland Ormond whom the patient saw on 12/15/16. The patient will be contacted with further instructions by Tanya Nones, RN and the hospital interpreter. Addendum by Jetta Lout, RRA on 12/16/16. Electronically Signed   By: Curlene Dolphin M.D.   On: 12/16/2016 10:30   Result Date: 12/16/2016 CLINICAL DATA:  The patient presents for biopsy of 2 separate areas of calcifications in the left breast. She was recently diagnosed with ductal carcinoma in situ of the left breast following stereotactic biopsy of retroareolar left breast calcifications. This biopsy was targeting the centrally positioned small group of calcifications in the left breast. EXAM: LEFT BREAST STEREOTACTIC CORE NEEDLE BIOPSY COMPARISON:  Previous exams. FINDINGS: The patient and I discussed the procedure of stereotactic-guided biopsy including benefits and alternatives. We discussed the high likelihood of a successful procedure. We discussed the risks of the procedure including infection, bleeding, tissue injury, clip migration, and inadequate sampling. Informed written consent was given. The usual time out protocol was performed immediately prior to the procedure. Using sterile technique and 1% Lidocaine as local anesthetic, under stereotactic guidance, a 9 gauge vacuum assisted device was used to perform core needle biopsy of calcifications in the central left breast using a superior to inferior approach. Specimen radiograph was performed showing a few faint calcifications. Specimens with calcifications are identified for pathology. Lesion quadrant: Lower outer quadrant At the conclusion of the  procedure, a X shaped tissue marker clip was deployed into the biopsy cavity. Follow-up 2-view mammogram was performed and dictated separately. IMPRESSION: Stereotactic-guided biopsy of the left breast. No apparent complications. Electronically Signed: By: Curlene Dolphin M.D. On: 12/13/2016 15:03   Mm Lt Plc Breast Loc Dev   1st Lesion  Inc Mammo Guide  Result Date: 01/02/2017 CLINICAL DATA:  Recently diagnosed ductal carcinoma in situ in the lower inner retroareolar left breast. The patient also has had recent biopsies of 2 additional groups of indeterminate calcifications in the left breast. These demonstrated discordant benign results with significant clip migration involving one of these. Re-biopsy of both of these areas was recommended but not performed at this time. She also had an ultrasound-guided core needle biopsy of a mass in the 1 o'clock position of the left breast demonstrating a fibroepithelial lesion with a differential diagnosis including fibroadenoma and phyllodes tumor. Due to the possibility of phyllodes tumor, surgical excision of this mass was recommended. The need for re-biopsy of the 2 areas of indeterminate calcifications and excision of the fibroepithelial lesion was discussed with Dr. Adonis Huguenin prior to today's procedure. He requested needle localization of the recently diagnosed ductal carcinoma in situ  and will address the other 3 areas later. EXAM: NEEDLE LOCALIZATION OF THE LEFT BREAST WITH MAMMO GUIDANCE COMPARISON:  Previous exams. FINDINGS: Patient presents for needle localization prior to left lumpectomy. I met with the patient and we discussed the procedure of needle localization including benefits and alternatives. We discussed the high likelihood of a successful procedure. We discussed the risks of the procedure, including infection, bleeding, tissue injury, and further surgery. Informed, written consent was given. The usual time-out protocol was performed immediately prior to  the procedure. Using mammographic guidance, sterile technique, 1% lidocaine and a 5 cm modified Kopans needle, the recently placed ribbon shaped biopsy marker clip in the lower inner retroareolar left breast was localized using an inferior approach. The images were marked for Dr. Adonis Huguenin. IMPRESSION: Needle localization left breast. No apparent complications. Electronically Signed   By: Claudie Revering M.D.   On: 01/02/2017 09:58    ASSESSMENT: Left breast DCIS.  PLAN:    1. Left breast DCIS: Patient underwent lumpectomy on January 02, 2017. Final pathology did not reveal an invasive component. She had consultation with radiation oncology earlier today for treatment planning for adjuvant XRT. She does not require chemotherapy. Return to clinic as planned for XRT and then in 8 weeks for further evaluation and initiation of tamoxifen.  2. Family history: Patient reports her sister had breast cancer at the age of 8 and was treated at [facility]. By report, her genetic testing was negative. Given patient's young age and the fact that she has 4 daughters will pursue genetic testing today.   Approximately 30 minutes was spent in discussion of which greater than 50% was consultation.  The entire visit was done in the presence of an interpreter.  Patient expressed understanding and was in agreement with this plan. She also understands that She can call clinic at any time with any questions, concerns, or complaints.   Cancer Staging Ductal carcinoma in situ (DCIS) of left breast Staging form: Breast, AJCC 8th Edition - Clinical stage from 12/15/2016: Stage 0 (cTis (DCIS), cN0, cM0, ER: Positive, PR: Positive, HER2: Negative) - Signed by Lloyd Huger, MD on 12/15/2016 - Pathologic stage from 01/09/2017: Stage 0 (pTis (DCIS), pN0, cM0, ER: Positive, PR: Positive, HER2: Negative) - Signed by Lloyd Huger, MD on 01/09/2017   Lloyd Huger, MD   01/09/2017 12:10 PM

## 2017-01-09 ENCOUNTER — Inpatient Hospital Stay: Payer: Managed Care, Other (non HMO) | Attending: Oncology | Admitting: Oncology

## 2017-01-09 ENCOUNTER — Ambulatory Visit
Admission: RE | Admit: 2017-01-09 | Discharge: 2017-01-09 | Disposition: A | Discharge status: Home / Self Care | Payer: Managed Care, Other (non HMO) | Source: Ambulatory Visit | Attending: Radiation Oncology | Admitting: Radiation Oncology

## 2017-01-09 ENCOUNTER — Encounter: Payer: Self-pay | Admitting: Radiation Oncology

## 2017-01-09 VITALS — BP 107/76 | Temp 97.0°F | Resp 18 | Wt 140.4 lb

## 2017-01-09 VITALS — BP 112/77 | HR 72 | Temp 96.9°F | Resp 18 | Ht 62.0 in | Wt 140.5 lb

## 2017-01-09 DIAGNOSIS — Z803 Family history of malignant neoplasm of breast: Secondary | ICD-10-CM

## 2017-01-09 DIAGNOSIS — F419 Anxiety disorder, unspecified: Secondary | ICD-10-CM | POA: Insufficient documentation

## 2017-01-09 DIAGNOSIS — Z510 Encounter for antineoplastic radiation therapy: Secondary | ICD-10-CM | POA: Diagnosis not present

## 2017-01-09 DIAGNOSIS — Z170 Estrogen receptor positive status [ER+]: Secondary | ICD-10-CM | POA: Insufficient documentation

## 2017-01-09 DIAGNOSIS — Z79899 Other long term (current) drug therapy: Secondary | ICD-10-CM | POA: Insufficient documentation

## 2017-01-09 DIAGNOSIS — D0512 Intraductal carcinoma in situ of left breast: Secondary | ICD-10-CM

## 2017-01-09 NOTE — Consult Note (Signed)
NEW PATIENT EVALUATION  Name: Sherry Short  MRN: [id number]  Date:   01/09/2017     DOB: 24-Jun-1975   This 41 y.o. 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  REFERRING PHYSICIAN: No ref. provider found  CHIEF COMPLAINT:  Chief Complaint  Patient presents with  . Breast Cancer    Pt is here for initial consultation of breast cancer.      DIAGNOSIS: The encounter diagnosis was Ductal carcinoma in situ (DCIS) of left breast.   PREVIOUS INVESTIGATIONS:  Mammogram and ultrasound reviewed Pathology reports reviewed Clinical notes reviewed  HPI: patient is a 41 year old Spanish-speaking female who presented with a self discovered mass in her left breast.this was followed by mammogram and ultrasound showing a 3 cm oval mass with obscured margins in the upper outer quadrant of the left breast. She underwent targeted ultrasound which was positive for ductal carcinoma in situ ER/PR positive.she then underwent a wide local excision and sentinel node biopsy. 2 sentinel lymph nodes were negative for malignancy. The breast mass measured 1.8 cm was nuclear grade 3 with comedonecrosis present. Margins were close at 1 mm to deep anterior and lateral margins. Patient tolerated her surgery well still somewhat sore at the incision sites. She specifically denies cough or bone pain. She is now referred to radiation oncology for consideration of opinion. She's or to see medical oncology who will place her on anti-estrogen therapy after completion of radiation.  PLANNED TREATMENT REGIMEN: whole breast radiation  PAST MEDICAL HISTORY:  has a past medical history of Anxiety; Cancer ([place]) (12/13/2016); Migraine headache; and Seasonal allergies.    PAST SURGICAL HISTORY:  Past Surgical History:  Procedure Laterality Date  . BREAST BIOPSY Left 11/30/2016   left  breast calcs path pending  . BREAST BIOPSY Left 12/13/2016   Left Affrim Bx- Path pending  . BREAST BIOPSY Left 12/13/2016   Left Affirm Bx- Path pending  . BREAST LUMPECTOMY Left 01/02/2017   Needle localization and lymph node disection  . BREAST LUMPECTOMY WITH SENTINEL LYMPH NODE BIOPSY Left 01/02/2017   Procedure: BREAST LUMPECTOMY WITH SENTINEL LYMPH NODE BX;  Surgeon: Clayburn Pert, MD;  Location: [facility] ORS;  Service: General;  Laterality: Left;  . TUBAL LIGATION      FAMILY HISTORY: family history includes Breast cancer (age of onset: 38) in her sister; Diabetes in her father; Healthy in her mother.  SOCIAL HISTORY:  reports that she has never smoked. She has never used smokeless tobacco. She reports that she does not drink alcohol or use drugs.  ALLERGIES: Patient has no known allergies.  MEDICATIONS:  Current Outpatient Prescriptions  Medication Sig Dispense Refill  . acetaminophen (TYLENOL) 325 MG tablet Take 650 mg by mouth every 6 (six) hours as needed for mild pain or moderate pain.     Marland Kitchen oxyCODONE-acetaminophen (ROXICET) 5-325 MG tablet Take 1-2 tablets by mouth every 4 (four) hours as needed. 30 tablet 0  . ondansetron (ZOFRAN ODT) 4 MG disintegrating tablet Take 1 tablet (4 mg total) by mouth every 8 (eight) hours as needed for nausea or vomiting. (Patient not taking: Reported on 01/09/2017) 20 tablet 0   No current facility-administered medications for this encounter.     ECOG PERFORMANCE STATUS:  0 - Asymptomatic  REVIEW OF SYSTEMS:  Patient denies any weight loss, fatigue, weakness, fever, chills or night sweats.  Patient denies any loss of vision, blurred vision. Patient denies any ringing  of the ears or hearing loss. No irregular heartbeat. Patient denies heart murmur or history of fainting. Patient denies any chest pain or pain radiating to her upper extremities. Patient denies any shortness of breath, difficulty breathing at night, cough or hemoptysis. Patient  denies any swelling in the lower legs. Patient denies any nausea vomiting, vomiting of blood, or coffee ground material in the vomitus. Patient denies any stomach pain. Patient states has had normal bowel movements no significant constipation or diarrhea. Patient denies any dysuria, hematuria or significant nocturia. Patient denies any problems walking, swelling in the joints or loss of balance. Patient denies any skin changes, loss of hair or loss of weight. Patient denies any excessive worrying or anxiety or significant depression. Patient denies any problems with insomnia. Patient denies excessive thirst, polyuria, polydipsia. Patient denies any swollen glands, patient denies easy bruising or easy bleeding. Patient denies any recent infections, allergies or URI. Patient "s visual fields have not changed significantly in recent time.    PHYSICAL EXAM: BP 112/77   Pulse 72   Temp (!) 96.9 F (36.1 C)   Resp 18   Ht 5\' 2"  (1.575 m)   Wt 140 lb 8.7 oz (63.7 kg)   LMP 12/31/2016   BMI 25.71 kg/m  Breasts are large and pendulous. She status post wide local excision the nipple areolar region of the left breast as well as a sentinel node biopsy site. Both areas are healing well. No other dominant mass or nodularity is noted in either breast in 2 positions examined. Well-developed well-nourished patient in NAD. HEENT reveals PERLA, EOMI, discs not visualized.  Oral cavity is clear. No oral mucosal lesions are identified. Neck is clear without evidence of cervical or supraclavicular adenopathy. Lungs are clear to A&P. Cardiac examination is essentially unremarkable with regular rate and rhythm without murmur rub or thrill. Abdomen is benign with no organomegaly or masses noted. Motor sensory and DTR levels are equal and symmetric in the upper and lower extremities. Cranial nerves II through XII are grossly intact. Proprioception is intact. No peripheral adenopathy or edema is identified. No motor or sensory  levels are noted. Crude visual fields are within normal range.  LABORATORY DATA: pathology reports reviewed    RADIOLOGY RESULTS:mammogram and ultrasound reviewed   IMPRESSION: stage 0 ductal carcinoma in situ ER/PR positive status post wide local excision and sentinel node biopsy of the left breast in 41 year old female  PLAN: at this time I recommend a course of whole breast radiation to her left breast. Based on the large size of her breast hypofractionated course of treatment would not be possible. I would plan on delivering 5040 cGy in 28 fractions. I also boost her scar another 1600 cGy using electron beam based on the close margin of 1 mm. I did discuss with surgeon I do not feel it's needful to achieve the 2 mm margin as I will high dose the scar site with electron beam. Risks and benefits of treatment including skin reaction fatigue alteration of blood counts possible inclusion of superficial lung all were discussed in detail with the patient. I personally set up and ordered CT simulation for next week to allow some more healing of her breast. Patient through her interpreter comprehend my treatment plan well. I have requested an interpreter for her simulation next week.  I would like to take this opportunity to thank you for allowing me to participate in  the care of your patient.Armstead Peaks., MD

## 2017-01-11 ENCOUNTER — Encounter: Payer: Self-pay | Admitting: General Surgery

## 2017-01-11 ENCOUNTER — Ambulatory Visit (INDEPENDENT_AMBULATORY_CARE_PROVIDER_SITE_OTHER): Payer: Self-pay | Admitting: General Surgery

## 2017-01-11 VITALS — BP 132/84 | HR 74 | Temp 98.1°F | Wt 142.0 lb

## 2017-01-11 DIAGNOSIS — Z4889 Encounter for other specified surgical aftercare: Secondary | ICD-10-CM

## 2017-01-11 NOTE — Patient Instructions (Signed)
La vamos a volver a ver en 2 semanas para estar seguro que se esta mejorando. Si tiene preguntas, por favor llamenos al [phone].

## 2017-01-11 NOTE — Progress Notes (Signed)
Outpatient Surgical Follow Up  01/11/2017  Sherry Short is an 41 y.o. female.   Chief Complaint  Patient presents with  . Routine Post Op    Left Breast Lumpectomy    HPI: 41 year female returns to clinic for follow up s/p left breast biopsy wth sentinel lymph node biopsy. She reports swelling to the axilla with pain but otherwise doing well. Her pain is currently controlled with as needed medications.She is eating well and denies any fevers, chills, nausea or vomiting. HAving normal bowel function.  Past Medical History:  Diagnosis Date  . Anxiety   . Cancer ([place]) 12/13/2016   Left Breast (DCIS)  . Migraine headache   . Seasonal allergies     Past Surgical History:  Procedure Laterality Date  . BREAST BIOPSY Left 11/30/2016   left breast calcs path pending  . BREAST BIOPSY Left 12/13/2016   Left Affrim Bx- Path pending  . BREAST BIOPSY Left 12/13/2016   Left Affirm Bx- Path pending  . BREAST LUMPECTOMY Left 01/02/2017   Needle localization and lymph node disection  . BREAST LUMPECTOMY WITH SENTINEL LYMPH NODE BIOPSY Left 01/02/2017   Procedure: BREAST LUMPECTOMY WITH SENTINEL LYMPH NODE BX;  Surgeon: Clayburn Pert, MD;  Location: [facility] ORS;  Service: General;  Laterality: Left;  . TUBAL LIGATION      Family History  Problem Relation Age of Onset  . Breast cancer Sister 82  . Healthy Mother   . Diabetes Father     Social History:  reports that she has never smoked. She has never used smokeless tobacco. She reports that she does not drink alcohol or use drugs.  Allergies: No Known Allergies  Medications reviewed.    ROS A multipoint ROS was completed, all pertinent findings are in the HPI and the remainder are negative   BP 132/84   Pulse 74   Temp 98.1 F (36.7 C) (Oral)   Wt 64.4 kg (142 lb)   LMP 12/31/2016   BMI 25.97 kg/m   Physical Exam  Gen: NAD CV: RRR Chest: CTA ABD: Soft, NT Breast; left breast with well approximated incisions with  palpable seroma to left axilla. No signs of infection to either sites   No results found for this or any previous visit (from the past 48 hour(s)). No results found.  Assessment/Plan:  1. Aftercare following surgery 41 year old female s/p left breast biopsy with LNBx. Having pain and swelling but otherwise doing well. Discussed signs of infection and to return to clinic immediately. Otherwise f/u in 2 weeks for anther wound check.     Clayburn Pert, MD FACS General Surgeon  01/11/2017,7:54 PM

## 2017-01-17 ENCOUNTER — Ambulatory Visit
Admission: RE | Admit: 2017-01-17 | Discharge: 2017-01-17 | Disposition: A | Discharge status: Home / Self Care | Payer: Managed Care, Other (non HMO) | Source: Ambulatory Visit | Attending: Radiation Oncology | Admitting: Radiation Oncology

## 2017-01-17 DIAGNOSIS — Z510 Encounter for antineoplastic radiation therapy: Secondary | ICD-10-CM | POA: Diagnosis not present

## 2017-01-18 DIAGNOSIS — Z510 Encounter for antineoplastic radiation therapy: Secondary | ICD-10-CM | POA: Diagnosis not present

## 2017-01-20 ENCOUNTER — Other Ambulatory Visit: Payer: Self-pay | Admitting: *Deleted

## 2017-01-20 DIAGNOSIS — D0511 Intraductal carcinoma in situ of right breast: Secondary | ICD-10-CM

## 2017-01-25 ENCOUNTER — Ambulatory Visit
Admission: RE | Admit: 2017-01-25 | Discharge: 2017-01-25 | Disposition: A | Discharge status: Home / Self Care | Payer: Managed Care, Other (non HMO) | Source: Ambulatory Visit | Attending: Radiation Oncology | Admitting: Radiation Oncology

## 2017-01-25 DIAGNOSIS — Z510 Encounter for antineoplastic radiation therapy: Secondary | ICD-10-CM | POA: Diagnosis not present

## 2017-01-26 ENCOUNTER — Encounter: Payer: Self-pay | Admitting: General Surgery

## 2017-01-26 ENCOUNTER — Ambulatory Visit (INDEPENDENT_AMBULATORY_CARE_PROVIDER_SITE_OTHER): Payer: Self-pay | Admitting: General Surgery

## 2017-01-26 ENCOUNTER — Ambulatory Visit
Admission: RE | Admit: 2017-01-26 | Discharge: 2017-01-26 | Disposition: A | Discharge status: Home / Self Care | Payer: Managed Care, Other (non HMO) | Source: Ambulatory Visit | Attending: Radiation Oncology | Admitting: Radiation Oncology

## 2017-01-26 VITALS — BP 110/77 | HR 74 | Temp 97.9°F | Ht 62.0 in | Wt 143.6 lb

## 2017-01-26 DIAGNOSIS — Z510 Encounter for antineoplastic radiation therapy: Secondary | ICD-10-CM | POA: Diagnosis not present

## 2017-01-26 DIAGNOSIS — Z4889 Encounter for other specified surgical aftercare: Secondary | ICD-10-CM

## 2017-01-26 MED ORDER — OXYCODONE-ACETAMINOPHEN 5-325 MG PO TABS: 1.0000 | ORAL_TABLET | Freq: Four times a day (QID) | ORAL | 0 refills | Status: DC | PRN

## 2017-01-26 NOTE — Patient Instructions (Addendum)
Nos vemos en nuestra oficina en 2 meses para el examen de mama. Obtendremos una mamografa antes de esta cita. Le avisar cuando estas citas estn programadas.  Contine haciendo sus exmenes de mama mensuales. Si tiene alguna pregunta o inquietud antes de su prxima cita programada, por favor llame a nuestra oficina y hable con una enfermera.  POST-operatorio GENERAL Instrucciones para el paciente instrucciones para el cuidado de la herida: Mantenga un apsito limpio y seco en la herida si hay drenaje. El vendaje inicial se puede retirar despus de 24 horas.  Una vez que la herida haya dejado de drenar, puede dejarla abierta al aire.  Si la ropa se frota contra la herida o causa irritacin y la herida no est drenando, puede cubrirla con un apsito seco Agricultural consultant.  Intente mantener la herida seca y evite los ungentos en la herida a menos que est dirigido para Nature conservation officer.  Si la herida se vuelve roja y dolorosa o comienza a Best boy infectado que no est claro, pngase en contacto inmediatamente con su mdico.  Si la herida es levemente rosada y tiene una cresta firme gruesa debajo de ella, esto es normal, y se conoce como una cresta curativa.  Esto se resolver en las prximas 4-6 semanas.  Baarse: Usted puede ducharse si usted ha sido informado de esto por su cirujano. Sin embargo, por favor no se sumerja en una tina, baera de hidromasaje o piscina hasta que las incisiones estn completamente selladas o su cirujano le haya dicho que puede [place].  DIETA: usted puede comer cualquier alimento que usted Administrator, arts.  Es Sherry Short buena idea comer una dieta de alta fibra y tomar en abundancia de lquidos para prevenir el estreimiento.  Si usted se vuelve estreido, es posible que desee tomar un laxante suave o tomar tabletas de ducolax diariamente hasta que sus hbitos intestinales sean regulares.  El estreimiento puede ser muy incmodo, junto con el esfuerzo, despus de la ciruga  reciente.  ACTIVIDAD: se le recomienda toser y Ambulance person hondo o usar su espirmetro de incentivo si le dieron uno, cada 15-30 minutos cuando est despierto.  Esto ayudar a prevenir complicaciones respiratorias y fiebres de grado bajo postoperatoriamente si usted tena un anestsico general.  Es posible que desee abrazar Fermin Schwab al toser y estornudar para agregar apoyo adicional al rea quirrgica, si usted tuvo ciruga abdominal o torcica, lo cual disminuir el dolor durante estos tiempos.  Se le anima a caminar y Chief Financial Officer en la actividad de la luz durante las prximas dos semanas.  Usted no debe levantar ms de 20 libras, hasta 02/13/2017, ya que podra poner en un mayor riesgo de complicaciones.  Veinte libras es aproximadamente equivalente a una bolsa de plstico de comestibles. En ese momento-escuche a su cuerpo al levantar, si usted tiene dolor al Lexicographer, pare y despus intente otra vez en [employer]. Dolor despus de Insurance risk surveyor o actividades de la vida diaria es normal a medida que vuelva a su rutina normal.  MEDICAMENTOS: trate de tomar medicamentos narcticos y medicamentos anti-inflamatorios, como Tylenol, ibuprofeno, Naprosyn, etc., con alimentos.  Esto reducir Social research officer, government del medicamento.  En caso de que usted desarrolle nuseas y vmitos con el medicamento para Conservation officer, historic buildings, o desarrolle una erupcin, por favor suspenda el medicamento y comunquese con su mdico.  Usted no debe conducir, tomar decisiones importantes o operar maquinaria cuando tome analgsicos narcticos.  Bloqueador solar Use bloqueador solar para el rea de incisin durante el prximo ao  si esta rea se expone al sol. Esto ayuda a disminuir las cicatrices y Chief Operating Officer un rea oscurecida permanente sobre la incisin.  PREGUNTAS: por favor sintase libre de llamar a nuestra oficina si usted tiene [place], y estaremos encantados de asistirle. [phone]  Le volveremos a ver en 2  meses para su mamografa y cita de seguimiento con el Dr. Adonis Huguenin.  Por favor, contine haciendo sus autoexmenes mensuales y si encuentra un nuevo bulto o cualquier inquietud, llame a nuestra oficina de inmediato.    Autoexamen de [employer] (Breast Self-Awareness) Autoexaminarse las mamas significa familiarizarse con el aspecto y la sensacin de las mamas al tacto. Incluye revisarse las mamas habitualmente e informarle al mdico acerca de cualquier cambio. Es importante autoexaminarse las [place]. Un cambio en las mamas puede ser un signo de un problema mdico grave. Familiarizarse con el aspecto de las mamas y con cmo se sienten al tacto Manufacturing systems engineer los problemas rpidamente, cuando es ms probable que el tratamiento resulte eficaz. Todas las mujeres deben autoexaminarse las [place], incluso aquellas que se sometieron a implantes mamarios. [place] forma de aprender qu es normal para sus mamas y si sufren modificaciones es Field seismologist un autoexamen. Para hacer un autoexamen de las mamas: Busque cambios 1. Qutese toda la ropa por encima de la cintura. 2. Prese frente a un espejo en una habitacin con buena iluminacin. 3. Apoye las manos en las caderas. 4. Empuje con fuerza hacia abajo con las manos. 5. [place] en el espejo. Busque diferencias entre ellas (asimetra), por ejemplo:  Diferencias en la forma.  Diferencias en el tamao.  Pliegues, depresiones y ndulos en [place], y no en la otra. 1. Observe cada mama para buscar cambios en la piel, por ejemplo:  Enrojecimiento.  Zonas escamosas. 1. Observe si hay cambios en los pezones, por ejemplo:  Secrecin.  Hemorragia.  Hoyuelos.  Enrojecimiento.  Un cambio en la posicin. Plpese para detectar si hay cambios Plpese las mamas con cuidado para detectar ndulos y [place]. Lo mejor es hacerlo mientras est acostada boca arriba en el piso y nuevamente mientras est sentada o de pie en  la ducha o la baera con agua jabonosa en la piel. Plpese cada mama de la siguiente forma:  Coloque el brazo del lado de la mama que se examina por arriba de la cabeza.  Plpese la mama con la [place].  Comience en la zona del pezn y haga crculos superpuestos de de pulgada (2cm). Para hacerlo, use las yemas de los tres dedos del [place]. Ejerza una presin [place], luego mediana y [place]. La presin Industrial/product designer el tejido ms cercano a la piel. La presin mediana le permitir palpar el tejido que est un poco ms profundo. La presin Advertising account executive el tejido ms cercano a las costillas.  Continuar superponiendo crculos y vaya hacia abajo, hasta sentir las [place], por debajo del [place].  Desplcese a una distancia del ancho de un dedo hacia el centro del cuerpo. Siga con los crculos superpuestos de de pulgada (2cm) para palpar la mama, mientras asciende lentamente hacia la clavcula.  Contine con el examen hacia arriba y [place] abajo con las tres presiones, Librarian, academic a Insurance risk surveyor. Anote sus hallazgos Anote qu es normal para cada mama y los cambios que encuentre. Debe llevar un registro escrito con los cambios o los hallazgos normales que encuentre para cada seno. Si registra esta informacin,  no tiene que depender solo de la memoria para Financial trader, la sensibilidad o la ubicacin de los [place]. Anote en qu momento se encuentra del ciclo menstrual, si usted todava est menstruando. Si tiene dificultad para Chiropractor, no se desaliente. Con el tiempo, se familiarizar con las variaciones de las mamas y se sentir ms cmoda con Visual merchandiser. CON QU FRECUENCIA DEBO EXAMINARME LAS MAMAS? Examnese las [employer]. Si est amamantando, el mejor momento para examinarse las mamas es despus de Economist o de usar un Engineer, petroleum. Si menstra, el mejor momento para examinarse las [place] es 5 a 7das despus de finalizado el  perodo menstrual. Durante la [place], las mamas estn abultadas, y tal vez sea ms difcil percibir los [place]. [place] A MI MDICO? Consulte al mdico si percibe lo siguiente:  Un cambio en la forma o el tamao de las mamas o los pezones.  Un cambio en la piel de las mamas o los pezones, como la piel enrojecida o escamosa.  Una secrecin anormal en los pezones.  Un ndulo o una zona engrosada que no tena antes.  Dolor de [place].  Cualquier cosa que la preocupe. Esta informacin no tiene Marine scientist el consejo del mdico. Asegrese de hacerle al mdico cualquier pregunta que tenga. Document Released: 03/28/2005 Document Revised: 07/20/2015 Document Reviewed: 02/15/2015 Elsevier Interactive Patient Education  [employer].

## 2017-01-26 NOTE — Progress Notes (Signed)
Outpatient Surgical Follow Up  01/26/2017  Sherry Short is an 41 y.o. female.   Chief Complaint  Patient presents with  . Routine Post Op    Left Breast Lumpectomy (01/02/17)- Dr. Adonis Huguenin    HPI: 41 year old female returns to clinic now 1 month status post left lumpectomy with sentinel lymph node biopsy. Patient reports she continues to have soreness to the area especially the upper outer incision site but that it is improving. Continues to occasionally need pain medication.she states she is eating well and having normal bowel function. She is artery started radiation therapy for her left-sided breast cancer. She denies any fevers, chills, nausea, vomiting, chest pain, shortness of breath, diarrhea, constipation.  Past Medical History:  Diagnosis Date  . Anxiety   . Cancer ([place]) 12/13/2016   Left Breast (DCIS)  . Migraine headache   . Seasonal allergies     Past Surgical History:  Procedure Laterality Date  . BREAST BIOPSY Left 11/30/2016   left breast calcs path pending  . BREAST BIOPSY Left 12/13/2016   Left Affrim Bx- Path pending  . BREAST BIOPSY Left 12/13/2016   Left Affirm Bx- Path pending  . BREAST LUMPECTOMY Left 01/02/2017   Needle localization and lymph node disection  . BREAST LUMPECTOMY WITH SENTINEL LYMPH NODE BIOPSY Left 01/02/2017   Procedure: BREAST LUMPECTOMY WITH SENTINEL LYMPH NODE BX;  Surgeon: Clayburn Pert, MD;  Location: [facility] ORS;  Service: General;  Laterality: Left;  . TUBAL LIGATION      Family History  Problem Relation Age of Onset  . Breast cancer Sister 74  . Healthy Mother   . Diabetes Father     Social History:  reports that she has never smoked. She has never used smokeless tobacco. She reports that she does not drink alcohol or use drugs.  Allergies: No Known Allergies  Medications reviewed.    ROS A multipoint review of systems was completed, all pertinent positives and negatives are documented within the history of   BP  110/77   Pulse 74   Temp 97.9 F (36.6 C) (Oral)   Ht 5\' 2"  (1.575 m)   Wt 65.1 kg (143 lb 9.6 oz)   LMP 12/31/2016   BMI 26.26 kg/m   Physical Exam Gen.: No acute distress Chest: Clear to auscultation Heart: Regular rhythm Breast: Left breast examined with well approximated infra-areolar andupper outer incision sites without any evidence of erythema or drainage. Markers from radiation therapy in place. Abdomen: Soft and nontender    No results found for this or any previous visit (from the past 48 hour(s)). No results found.  Assessment/Plan:  1. Aftercare following surgery 41 year old female status post left breast lumpectomy and sentinel lymph node biopsy for high-grade DCIS. Doing very well. Divided with a small prescription of additional pain medications due to soreness. Discussed anticipated continued recovery. She voiced understanding of the importance of continued self breast exams and will follow-up in clinic for an additional breast exam in 2 months.     Clayburn Pert, MD FACS General Surgeon  01/26/2017,10:08 AM

## 2017-01-27 ENCOUNTER — Ambulatory Visit
Admission: RE | Admit: 2017-01-27 | Discharge: 2017-01-27 | Disposition: A | Discharge status: Home / Self Care | Payer: Managed Care, Other (non HMO) | Source: Ambulatory Visit | Attending: Radiation Oncology | Admitting: Radiation Oncology

## 2017-01-27 DIAGNOSIS — Z510 Encounter for antineoplastic radiation therapy: Secondary | ICD-10-CM | POA: Diagnosis not present

## 2017-01-30 ENCOUNTER — Ambulatory Visit: Payer: Managed Care, Other (non HMO)

## 2017-01-31 ENCOUNTER — Ambulatory Visit
Admission: RE | Admit: 2017-01-31 | Discharge: 2017-01-31 | Disposition: A | Discharge status: Home / Self Care | Payer: Managed Care, Other (non HMO) | Source: Ambulatory Visit | Attending: Radiation Oncology | Admitting: Radiation Oncology

## 2017-01-31 DIAGNOSIS — Z510 Encounter for antineoplastic radiation therapy: Secondary | ICD-10-CM | POA: Diagnosis not present

## 2017-02-01 ENCOUNTER — Ambulatory Visit
Admission: RE | Admit: 2017-02-01 | Discharge: 2017-02-01 | Disposition: A | Discharge status: Home / Self Care | Payer: Managed Care, Other (non HMO) | Source: Ambulatory Visit | Attending: Radiation Oncology | Admitting: Radiation Oncology

## 2017-02-01 DIAGNOSIS — Z510 Encounter for antineoplastic radiation therapy: Secondary | ICD-10-CM | POA: Diagnosis not present

## 2017-02-02 ENCOUNTER — Ambulatory Visit
Admission: RE | Admit: 2017-02-02 | Discharge: 2017-02-02 | Disposition: A | Discharge status: Home / Self Care | Payer: Managed Care, Other (non HMO) | Source: Ambulatory Visit | Attending: Radiation Oncology | Admitting: Radiation Oncology

## 2017-02-02 DIAGNOSIS — Z510 Encounter for antineoplastic radiation therapy: Secondary | ICD-10-CM | POA: Diagnosis not present

## 2017-02-03 ENCOUNTER — Ambulatory Visit
Admission: RE | Admit: 2017-02-03 | Discharge: 2017-02-03 | Disposition: A | Discharge status: Home / Self Care | Payer: Managed Care, Other (non HMO) | Source: Ambulatory Visit | Attending: Radiation Oncology | Admitting: Radiation Oncology

## 2017-02-03 DIAGNOSIS — Z510 Encounter for antineoplastic radiation therapy: Secondary | ICD-10-CM | POA: Diagnosis not present

## 2017-02-06 ENCOUNTER — Ambulatory Visit
Admission: RE | Admit: 2017-02-06 | Discharge: 2017-02-06 | Disposition: A | Discharge status: Home / Self Care | Payer: Managed Care, Other (non HMO) | Source: Ambulatory Visit | Attending: Radiation Oncology | Admitting: Radiation Oncology

## 2017-02-06 DIAGNOSIS — Z510 Encounter for antineoplastic radiation therapy: Secondary | ICD-10-CM | POA: Diagnosis not present

## 2017-02-07 ENCOUNTER — Ambulatory Visit
Admission: RE | Admit: 2017-02-07 | Discharge: 2017-02-07 | Disposition: A | Discharge status: Home / Self Care | Payer: Managed Care, Other (non HMO) | Source: Ambulatory Visit | Attending: Radiation Oncology | Admitting: Radiation Oncology

## 2017-02-07 DIAGNOSIS — Z510 Encounter for antineoplastic radiation therapy: Secondary | ICD-10-CM | POA: Diagnosis not present

## 2017-02-08 ENCOUNTER — Inpatient Hospital Stay: Payer: Managed Care, Other (non HMO)

## 2017-02-08 ENCOUNTER — Ambulatory Visit
Admission: RE | Admit: 2017-02-08 | Discharge: 2017-02-08 | Disposition: A | Discharge status: Home / Self Care | Payer: Managed Care, Other (non HMO) | Source: Ambulatory Visit | Attending: Radiation Oncology | Admitting: Radiation Oncology

## 2017-02-08 ENCOUNTER — Encounter: Payer: Self-pay | Admitting: *Deleted

## 2017-02-08 DIAGNOSIS — Z510 Encounter for antineoplastic radiation therapy: Secondary | ICD-10-CM | POA: Diagnosis not present

## 2017-02-08 DIAGNOSIS — D0512 Intraductal carcinoma in situ of left breast: Secondary | ICD-10-CM | POA: Diagnosis not present

## 2017-02-08 DIAGNOSIS — D0511 Intraductal carcinoma in situ of right breast: Secondary | ICD-10-CM

## 2017-02-08 LAB — CBC
HCT: 37.4 % (ref 35.0–47.0)
HEMOGLOBIN: 12.7 g/dL (ref 12.0–16.0)
MCH: 29.1 pg (ref 26.0–34.0)
MCHC: 34.1 g/dL (ref 32.0–36.0)
MCV: 85.5 fL (ref 80.0–100.0)
Platelets: 283 10*3/uL (ref 150–440)
RBC: 4.37 MIL/uL (ref 3.80–5.20)
RDW: 13.9 % (ref 11.5–14.5)
WBC: 8.1 10*3/uL (ref 3.6–11.0)

## 2017-02-09 ENCOUNTER — Ambulatory Visit
Admission: RE | Admit: 2017-02-09 | Discharge: 2017-02-09 | Disposition: A | Discharge status: Home / Self Care | Payer: Managed Care, Other (non HMO) | Source: Ambulatory Visit | Attending: Radiation Oncology | Admitting: Radiation Oncology

## 2017-02-09 DIAGNOSIS — Z510 Encounter for antineoplastic radiation therapy: Secondary | ICD-10-CM | POA: Diagnosis not present

## 2017-02-10 ENCOUNTER — Ambulatory Visit
Admission: RE | Admit: 2017-02-10 | Discharge: 2017-02-10 | Disposition: A | Discharge status: Home / Self Care | Payer: Managed Care, Other (non HMO) | Source: Ambulatory Visit | Attending: Radiation Oncology | Admitting: Radiation Oncology

## 2017-02-10 DIAGNOSIS — Z510 Encounter for antineoplastic radiation therapy: Secondary | ICD-10-CM | POA: Diagnosis not present

## 2017-02-13 ENCOUNTER — Ambulatory Visit
Admission: RE | Admit: 2017-02-13 | Discharge: 2017-02-13 | Disposition: A | Discharge status: Home / Self Care | Payer: Managed Care, Other (non HMO) | Source: Ambulatory Visit | Attending: Radiation Oncology | Admitting: Radiation Oncology

## 2017-02-13 DIAGNOSIS — Z510 Encounter for antineoplastic radiation therapy: Secondary | ICD-10-CM | POA: Diagnosis not present

## 2017-02-14 ENCOUNTER — Ambulatory Visit
Admission: RE | Admit: 2017-02-14 | Discharge: 2017-02-14 | Disposition: A | Discharge status: Home / Self Care | Payer: Managed Care, Other (non HMO) | Source: Ambulatory Visit | Attending: Radiation Oncology | Admitting: Radiation Oncology

## 2017-02-14 DIAGNOSIS — Z510 Encounter for antineoplastic radiation therapy: Secondary | ICD-10-CM | POA: Diagnosis not present

## 2017-02-15 ENCOUNTER — Ambulatory Visit
Admission: RE | Admit: 2017-02-15 | Discharge: 2017-02-15 | Disposition: A | Discharge status: Home / Self Care | Payer: Managed Care, Other (non HMO) | Source: Ambulatory Visit | Attending: Radiation Oncology | Admitting: Radiation Oncology

## 2017-02-15 DIAGNOSIS — Z510 Encounter for antineoplastic radiation therapy: Secondary | ICD-10-CM | POA: Diagnosis not present

## 2017-02-16 ENCOUNTER — Ambulatory Visit
Admission: RE | Admit: 2017-02-16 | Discharge: 2017-02-16 | Disposition: A | Discharge status: Home / Self Care | Payer: Managed Care, Other (non HMO) | Source: Ambulatory Visit | Attending: Radiation Oncology | Admitting: Radiation Oncology

## 2017-02-16 DIAGNOSIS — Z510 Encounter for antineoplastic radiation therapy: Secondary | ICD-10-CM | POA: Diagnosis not present

## 2017-02-17 ENCOUNTER — Ambulatory Visit
Admission: RE | Admit: 2017-02-17 | Discharge: 2017-02-17 | Disposition: A | Discharge status: Home / Self Care | Payer: Managed Care, Other (non HMO) | Source: Ambulatory Visit | Attending: Radiation Oncology | Admitting: Radiation Oncology

## 2017-02-17 DIAGNOSIS — Z510 Encounter for antineoplastic radiation therapy: Secondary | ICD-10-CM | POA: Diagnosis not present

## 2017-02-20 ENCOUNTER — Ambulatory Visit
Admission: RE | Admit: 2017-02-20 | Discharge: 2017-02-20 | Disposition: A | Discharge status: Home / Self Care | Payer: Managed Care, Other (non HMO) | Source: Ambulatory Visit | Attending: Radiation Oncology | Admitting: Radiation Oncology

## 2017-02-20 DIAGNOSIS — Z510 Encounter for antineoplastic radiation therapy: Secondary | ICD-10-CM | POA: Diagnosis not present

## 2017-02-21 ENCOUNTER — Ambulatory Visit
Admission: RE | Admit: 2017-02-21 | Discharge: 2017-02-21 | Disposition: A | Discharge status: Home / Self Care | Payer: Managed Care, Other (non HMO) | Source: Ambulatory Visit | Attending: Radiation Oncology | Admitting: Radiation Oncology

## 2017-02-21 DIAGNOSIS — Z510 Encounter for antineoplastic radiation therapy: Secondary | ICD-10-CM | POA: Diagnosis not present

## 2017-02-22 ENCOUNTER — Inpatient Hospital Stay: Payer: Managed Care, Other (non HMO) | Attending: Oncology

## 2017-02-22 ENCOUNTER — Ambulatory Visit
Admission: RE | Admit: 2017-02-22 | Discharge: 2017-02-22 | Disposition: A | Discharge status: Home / Self Care | Payer: Managed Care, Other (non HMO) | Source: Ambulatory Visit | Attending: Radiation Oncology | Admitting: Radiation Oncology

## 2017-02-22 DIAGNOSIS — D0512 Intraductal carcinoma in situ of left breast: Secondary | ICD-10-CM | POA: Diagnosis not present

## 2017-02-22 DIAGNOSIS — Z803 Family history of malignant neoplasm of breast: Secondary | ICD-10-CM | POA: Insufficient documentation

## 2017-02-22 DIAGNOSIS — Z923 Personal history of irradiation: Secondary | ICD-10-CM | POA: Insufficient documentation

## 2017-02-22 DIAGNOSIS — Z170 Estrogen receptor positive status [ER+]: Secondary | ICD-10-CM | POA: Diagnosis not present

## 2017-02-22 DIAGNOSIS — Z510 Encounter for antineoplastic radiation therapy: Secondary | ICD-10-CM | POA: Diagnosis not present

## 2017-02-22 DIAGNOSIS — F419 Anxiety disorder, unspecified: Secondary | ICD-10-CM | POA: Diagnosis not present

## 2017-02-22 DIAGNOSIS — Z79810 Long term (current) use of selective estrogen receptor modulators (SERMs): Secondary | ICD-10-CM | POA: Insufficient documentation

## 2017-02-22 DIAGNOSIS — D0511 Intraductal carcinoma in situ of right breast: Secondary | ICD-10-CM

## 2017-02-22 LAB — CBC
HCT: 37.5 % (ref 35.0–47.0)
Hemoglobin: 12.7 g/dL (ref 12.0–16.0)
MCH: 29.3 pg (ref 26.0–34.0)
MCHC: 33.9 g/dL (ref 32.0–36.0)
MCV: 86.5 fL (ref 80.0–100.0)
Platelets: 260 10*3/uL (ref 150–440)
RBC: 4.34 MIL/uL (ref 3.80–5.20)
RDW: 14.1 % (ref 11.5–14.5)
WBC: 5.9 10*3/uL (ref 3.6–11.0)

## 2017-02-23 ENCOUNTER — Ambulatory Visit
Admission: RE | Admit: 2017-02-23 | Discharge: 2017-02-23 | Disposition: A | Discharge status: Home / Self Care | Payer: Managed Care, Other (non HMO) | Source: Ambulatory Visit | Attending: Radiation Oncology | Admitting: Radiation Oncology

## 2017-02-23 DIAGNOSIS — Z510 Encounter for antineoplastic radiation therapy: Secondary | ICD-10-CM | POA: Diagnosis not present

## 2017-02-24 ENCOUNTER — Ambulatory Visit
Admission: RE | Admit: 2017-02-24 | Discharge: 2017-02-24 | Disposition: A | Discharge status: Home / Self Care | Payer: Managed Care, Other (non HMO) | Source: Ambulatory Visit | Attending: Radiation Oncology | Admitting: Radiation Oncology

## 2017-02-24 ENCOUNTER — Other Ambulatory Visit: Payer: Self-pay | Admitting: *Deleted

## 2017-02-24 DIAGNOSIS — Z510 Encounter for antineoplastic radiation therapy: Secondary | ICD-10-CM | POA: Diagnosis not present

## 2017-02-24 MED ORDER — SILVER SULFADIAZINE 1 % EX CREA: 1.0000 "application " | TOPICAL_CREAM | Freq: Two times a day (BID) | CUTANEOUS | 2 refills | Status: DC

## 2017-02-25 ENCOUNTER — Ambulatory Visit: Payer: Managed Care, Other (non HMO)

## 2017-02-27 ENCOUNTER — Ambulatory Visit
Admission: RE | Admit: 2017-02-27 | Discharge: 2017-02-27 | Disposition: A | Discharge status: Home / Self Care | Payer: Managed Care, Other (non HMO) | Source: Ambulatory Visit | Attending: Radiation Oncology | Admitting: Radiation Oncology

## 2017-02-27 DIAGNOSIS — Z510 Encounter for antineoplastic radiation therapy: Secondary | ICD-10-CM | POA: Diagnosis not present

## 2017-02-28 ENCOUNTER — Ambulatory Visit
Admission: RE | Admit: 2017-02-28 | Discharge: 2017-02-28 | Disposition: A | Discharge status: Home / Self Care | Payer: Managed Care, Other (non HMO) | Source: Ambulatory Visit | Attending: Radiation Oncology | Admitting: Radiation Oncology

## 2017-02-28 DIAGNOSIS — Z510 Encounter for antineoplastic radiation therapy: Secondary | ICD-10-CM | POA: Diagnosis not present

## 2017-03-01 ENCOUNTER — Ambulatory Visit
Admission: RE | Admit: 2017-03-01 | Discharge: 2017-03-01 | Disposition: A | Discharge status: Home / Self Care | Payer: Managed Care, Other (non HMO) | Source: Ambulatory Visit | Attending: Radiation Oncology | Admitting: Radiation Oncology

## 2017-03-01 DIAGNOSIS — Z510 Encounter for antineoplastic radiation therapy: Secondary | ICD-10-CM | POA: Diagnosis not present

## 2017-03-05 NOTE — Progress Notes (Signed)
[place]  [phone] [phone]  ID: Sherry Short OB: 1975/09/19  MR#: [id number]  CSN#:[id number]  Patient Care Team: Patient, No Pcp Per as PCP - General (General Practice)  CHIEF COMPLAINT: Left breast DCIS.  INTERVAL HISTORY: Patient returns to clinic today for further evaluation and initiation of tamoxifen.  She is currently taking adjuvant XRT and tolerating it well without significant side effects. She currently feels well and is asymptomatic. She has no neurologic complaints. She denies any recent fevers or illnesses. She has a good appetite and denies weight loss. She denies any pain. She has no chest pain or shortness of breath. She denies any nausea, vomiting, constipation, or diarrhea. She has no urinary complaints. Patient offers no specific complaints today.  REVIEW OF SYSTEMS:   Review of Systems  Constitutional: Negative.  Negative for fever, malaise/fatigue and weight loss.  Respiratory: Negative.  Negative for cough and shortness of breath.   Cardiovascular: Negative.  Negative for chest pain and leg swelling.  Gastrointestinal: Negative.  Negative for abdominal pain.  Genitourinary: Negative.  Negative for dysuria.  Musculoskeletal: Negative.   Skin: Negative.  Negative for rash.  Neurological: Negative.  Negative for sensory change and weakness.  Psychiatric/Behavioral: Negative.  The patient is not nervous/anxious.     As per HPI. Otherwise, a complete review of systems is negative.  PAST MEDICAL HISTORY: Past Medical History:  Diagnosis Date  . Anxiety   . Cancer ([place]) 12/13/2016   Left Breast (DCIS)  . Migraine headache   . Seasonal allergies     PAST SURGICAL HISTORY: Past Surgical History:  Procedure Laterality Date  . BREAST BIOPSY Left 11/30/2016   left breast calcs path pending  . BREAST BIOPSY Left 12/13/2016   Left Affrim Bx- Path pending  . BREAST BIOPSY Left 12/13/2016   Left Affirm Bx- Path  pending  . BREAST LUMPECTOMY Left 01/02/2017   Needle localization and lymph node disection  . BREAST LUMPECTOMY WITH SENTINEL LYMPH NODE BIOPSY Left 01/02/2017   Procedure: BREAST LUMPECTOMY WITH SENTINEL LYMPH NODE BX;  Surgeon: Clayburn Pert, MD;  Location: [facility] ORS;  Service: General;  Laterality: Left;  . TUBAL LIGATION      FAMILY HISTORY: Family History  Problem Relation Age of Onset  . Breast cancer Sister 81  . Healthy Mother   . Diabetes Father     ADVANCED DIRECTIVES (Y/N):  N  HEALTH MAINTENANCE: Social History   Tobacco Use  . Smoking status: Never Smoker  . Smokeless tobacco: Never Used  Substance Use Topics  . Alcohol use: No  . Drug use: No     Colonoscopy:  PAP:  Bone density:  Lipid panel:  No Known Allergies  Current Outpatient Medications  Medication Sig Dispense Refill  . silver sulfADIAZINE (SILVADENE) 1 % cream Apply 1 application 2 (two) times daily topically. 50 g 2  . tamoxifen (NOLVADEX) 20 MG tablet Take 1 tablet (20 mg total) by mouth daily. 90 tablet 3   No current facility-administered medications for this visit.     OBJECTIVE: Vitals:   03/06/17 1349  BP: 103/71  Pulse: 75  Resp: 18  Temp: (!) 97.1 F (36.2 C)     Body mass index is 26.41 kg/m.    ECOG FS:0 - Asymptomatic  General: Well-developed, well-nourished, no acute distress. Eyes: Pink conjunctiva, anicteric sclera. Breasts: Left breast with well-healed surgical scar.  Lungs: Clear to auscultation bilaterally. Heart: Regular rate and rhythm. No rubs, murmurs, or gallops.  Abdomen: Soft, nontender, nondistended. No organomegaly noted, normoactive bowel sounds. Musculoskeletal: No edema, cyanosis, or clubbing. Neuro: Alert, answering all questions appropriately. Cranial nerves grossly intact. Skin: No rashes or petechiae noted. Psych: Normal affect.   LAB RESULTS:  Lab Results  Component Value Date   NA 139 08/09/2015   K 4.1 08/09/2015   CL 110 08/09/2015     CO2 22 08/09/2015   GLUCOSE 98 08/09/2015   BUN 16 08/09/2015   CREATININE 1.02 (H) 08/09/2015   CALCIUM 9.2 08/09/2015   PROT 7.5 08/09/2015   ALBUMIN 4.3 08/09/2015   AST 18 08/09/2015   ALT 13 (L) 08/09/2015   ALKPHOS 68 08/09/2015   BILITOT 0.7 08/09/2015   GFRNONAA >60 08/09/2015   GFRAA >60 08/09/2015    Lab Results  Component Value Date   WBC 7.1 03/08/2017   HGB 12.9 03/08/2017   HCT 38.4 03/08/2017   MCV 85.9 03/08/2017   PLT 261 03/08/2017     STUDIES: No results found.  ASSESSMENT: Left breast DCIS.  PLAN:    1. Left breast DCIS: Patient underwent lumpectomy on January 02, 2017. Final pathology did not reveal an invasive component.  Continue with daily XRT with her final treatment on March 21, 2017.  Patient was given a prescription for tamoxifen today and instructed to initiate treatment at the conclusion of her XRT.  She will take 5 years of tamoxifen completing in December 2023.  Return to clinic in 3 months for further evaluation.   2. Family history: Patient reports her sister had breast cancer at the age of 40 and was treated at [facility]. By report, her genetic testing was negative.  Patient's genetic testing is negative.  Approximately 30 minutes was spent in discussion of which greater than 50% was consultation.  The entire visit was done in the presence of an interpreter.  Patient expressed understanding and was in agreement with this plan. She also understands that She can call clinic at any time with any questions, concerns, or complaints.   Cancer Staging Ductal carcinoma in situ (DCIS) of left breast Staging form: Breast, AJCC 8th Edition - Clinical stage from 12/15/2016: Stage 0 (cTis (DCIS), cN0, cM0, ER: Positive, PR: Positive, HER2: Negative) - Signed by Lloyd Huger, MD on 12/15/2016 - Pathologic stage from 01/09/2017: Stage 0 (pTis (DCIS), pN0, cM0, ER: Positive, PR: Positive, HER2: Negative) - Signed by Lloyd Huger, MD on  01/09/2017   Lloyd Huger, MD   03/08/2017 3:44 PM

## 2017-03-06 ENCOUNTER — Ambulatory Visit
Admission: RE | Admit: 2017-03-06 | Discharge: 2017-03-06 | Disposition: A | Discharge status: Home / Self Care | Payer: Managed Care, Other (non HMO) | Source: Ambulatory Visit | Attending: Radiation Oncology | Admitting: Radiation Oncology

## 2017-03-06 ENCOUNTER — Other Ambulatory Visit: Payer: Self-pay

## 2017-03-06 ENCOUNTER — Encounter: Payer: Self-pay | Admitting: Oncology

## 2017-03-06 ENCOUNTER — Inpatient Hospital Stay (HOSPITAL_BASED_OUTPATIENT_CLINIC_OR_DEPARTMENT_OTHER): Payer: Managed Care, Other (non HMO) | Admitting: Oncology

## 2017-03-06 VITALS — BP 103/71 | HR 75 | Temp 97.1°F | Resp 18 | Wt 144.4 lb

## 2017-03-06 DIAGNOSIS — Z170 Estrogen receptor positive status [ER+]: Secondary | ICD-10-CM

## 2017-03-06 DIAGNOSIS — Z923 Personal history of irradiation: Secondary | ICD-10-CM

## 2017-03-06 DIAGNOSIS — Z510 Encounter for antineoplastic radiation therapy: Secondary | ICD-10-CM | POA: Diagnosis not present

## 2017-03-06 DIAGNOSIS — D0512 Intraductal carcinoma in situ of left breast: Secondary | ICD-10-CM

## 2017-03-06 DIAGNOSIS — F419 Anxiety disorder, unspecified: Secondary | ICD-10-CM

## 2017-03-06 DIAGNOSIS — Z803 Family history of malignant neoplasm of breast: Secondary | ICD-10-CM

## 2017-03-06 DIAGNOSIS — Z79810 Long term (current) use of selective estrogen receptor modulators (SERMs): Secondary | ICD-10-CM

## 2017-03-06 MED ORDER — TAMOXIFEN CITRATE 20 MG PO TABS: 20.0000 mg | ORAL_TABLET | Freq: Every day | ORAL | 3 refills | Status: DC

## 2017-03-06 NOTE — Progress Notes (Signed)
Patient here today for follow up regarding DCIS, reports she will finish radiation in the middle of December.

## 2017-03-07 ENCOUNTER — Ambulatory Visit
Admission: RE | Admit: 2017-03-07 | Discharge: 2017-03-07 | Disposition: A | Discharge status: Home / Self Care | Payer: Managed Care, Other (non HMO) | Source: Ambulatory Visit | Attending: Radiation Oncology | Admitting: Radiation Oncology

## 2017-03-07 DIAGNOSIS — Z510 Encounter for antineoplastic radiation therapy: Secondary | ICD-10-CM | POA: Diagnosis not present

## 2017-03-08 ENCOUNTER — Ambulatory Visit
Admission: RE | Admit: 2017-03-08 | Discharge: 2017-03-08 | Disposition: A | Discharge status: Home / Self Care | Payer: Managed Care, Other (non HMO) | Source: Ambulatory Visit | Attending: Radiation Oncology | Admitting: Radiation Oncology

## 2017-03-08 ENCOUNTER — Inpatient Hospital Stay: Payer: Managed Care, Other (non HMO)

## 2017-03-08 DIAGNOSIS — D0511 Intraductal carcinoma in situ of right breast: Secondary | ICD-10-CM

## 2017-03-08 DIAGNOSIS — D0512 Intraductal carcinoma in situ of left breast: Secondary | ICD-10-CM | POA: Diagnosis not present

## 2017-03-08 DIAGNOSIS — Z510 Encounter for antineoplastic radiation therapy: Secondary | ICD-10-CM | POA: Diagnosis not present

## 2017-03-08 LAB — CBC
HEMATOCRIT: 38.4 % (ref 35.0–47.0)
HEMOGLOBIN: 12.9 g/dL (ref 12.0–16.0)
MCH: 28.8 pg (ref 26.0–34.0)
MCHC: 33.5 g/dL (ref 32.0–36.0)
MCV: 85.9 fL (ref 80.0–100.0)
Platelets: 261 10*3/uL (ref 150–440)
RBC: 4.47 MIL/uL (ref 3.80–5.20)
RDW: 13.8 % (ref 11.5–14.5)
WBC: 7.1 10*3/uL (ref 3.6–11.0)

## 2017-03-09 ENCOUNTER — Ambulatory Visit
Admission: RE | Admit: 2017-03-09 | Discharge: 2017-03-09 | Disposition: A | Discharge status: Home / Self Care | Payer: Managed Care, Other (non HMO) | Source: Ambulatory Visit | Attending: Radiation Oncology | Admitting: Radiation Oncology

## 2017-03-09 ENCOUNTER — Ambulatory Visit: Payer: Managed Care, Other (non HMO)

## 2017-03-09 ENCOUNTER — Inpatient Hospital Stay: Payer: Managed Care, Other (non HMO)

## 2017-03-09 DIAGNOSIS — Z510 Encounter for antineoplastic radiation therapy: Secondary | ICD-10-CM | POA: Diagnosis not present

## 2017-03-10 ENCOUNTER — Ambulatory Visit
Admission: RE | Admit: 2017-03-10 | Discharge: 2017-03-10 | Disposition: A | Discharge status: Home / Self Care | Payer: Managed Care, Other (non HMO) | Source: Ambulatory Visit | Attending: Radiation Oncology | Admitting: Radiation Oncology

## 2017-03-10 DIAGNOSIS — Z510 Encounter for antineoplastic radiation therapy: Secondary | ICD-10-CM | POA: Diagnosis not present

## 2017-03-13 ENCOUNTER — Ambulatory Visit
Admission: RE | Admit: 2017-03-13 | Discharge: 2017-03-13 | Disposition: A | Discharge status: Home / Self Care | Payer: Managed Care, Other (non HMO) | Source: Ambulatory Visit | Attending: Radiation Oncology | Admitting: Radiation Oncology

## 2017-03-13 DIAGNOSIS — Z510 Encounter for antineoplastic radiation therapy: Secondary | ICD-10-CM | POA: Diagnosis not present

## 2017-03-14 ENCOUNTER — Ambulatory Visit
Admission: RE | Admit: 2017-03-14 | Discharge: 2017-03-14 | Disposition: A | Discharge status: Home / Self Care | Payer: Managed Care, Other (non HMO) | Source: Ambulatory Visit | Attending: Radiation Oncology | Admitting: Radiation Oncology

## 2017-03-14 DIAGNOSIS — Z510 Encounter for antineoplastic radiation therapy: Secondary | ICD-10-CM | POA: Diagnosis not present

## 2017-03-15 ENCOUNTER — Ambulatory Visit
Admission: RE | Admit: 2017-03-15 | Discharge: 2017-03-15 | Disposition: A | Discharge status: Home / Self Care | Payer: Managed Care, Other (non HMO) | Source: Ambulatory Visit | Attending: Radiation Oncology | Admitting: Radiation Oncology

## 2017-03-15 DIAGNOSIS — Z510 Encounter for antineoplastic radiation therapy: Secondary | ICD-10-CM | POA: Diagnosis not present

## 2017-03-16 ENCOUNTER — Ambulatory Visit
Admission: RE | Admit: 2017-03-16 | Discharge: 2017-03-16 | Disposition: A | Discharge status: Home / Self Care | Payer: Managed Care, Other (non HMO) | Source: Ambulatory Visit | Attending: Radiation Oncology | Admitting: Radiation Oncology

## 2017-03-16 DIAGNOSIS — Z510 Encounter for antineoplastic radiation therapy: Secondary | ICD-10-CM | POA: Diagnosis not present

## 2017-03-17 ENCOUNTER — Ambulatory Visit
Admission: RE | Admit: 2017-03-17 | Discharge: 2017-03-17 | Disposition: A | Discharge status: Home / Self Care | Payer: Managed Care, Other (non HMO) | Source: Ambulatory Visit | Attending: Radiation Oncology | Admitting: Radiation Oncology

## 2017-03-17 DIAGNOSIS — Z510 Encounter for antineoplastic radiation therapy: Secondary | ICD-10-CM | POA: Diagnosis not present

## 2017-03-20 ENCOUNTER — Ambulatory Visit: Payer: Managed Care, Other (non HMO)

## 2017-03-21 ENCOUNTER — Ambulatory Visit: Payer: Managed Care, Other (non HMO)

## 2017-03-21 ENCOUNTER — Ambulatory Visit
Admission: RE | Admit: 2017-03-21 | Discharge: 2017-03-21 | Disposition: A | Discharge status: Home / Self Care | Payer: Managed Care, Other (non HMO) | Source: Ambulatory Visit | Attending: Radiation Oncology | Admitting: Radiation Oncology

## 2017-03-21 DIAGNOSIS — Z510 Encounter for antineoplastic radiation therapy: Secondary | ICD-10-CM | POA: Diagnosis not present

## 2017-03-22 ENCOUNTER — Ambulatory Visit
Admission: RE | Admit: 2017-03-22 | Discharge: 2017-03-22 | Disposition: A | Discharge status: Home / Self Care | Payer: Managed Care, Other (non HMO) | Source: Ambulatory Visit | Attending: Radiation Oncology | Admitting: Radiation Oncology

## 2017-03-22 DIAGNOSIS — Z510 Encounter for antineoplastic radiation therapy: Secondary | ICD-10-CM | POA: Diagnosis not present

## 2017-03-28 ENCOUNTER — Other Ambulatory Visit: Payer: Self-pay

## 2017-03-28 DIAGNOSIS — Z9889 Other specified postprocedural states: Secondary | ICD-10-CM

## 2017-03-28 NOTE — Progress Notes (Signed)
[place] [facility] appointment is scheduled for 05/01/2017.

## 2017-03-31 ENCOUNTER — Ambulatory Visit: Payer: Self-pay | Admitting: General Surgery

## 2017-04-17 ENCOUNTER — Encounter: Payer: Self-pay | Admitting: Genetic Counselor

## 2017-04-17 DIAGNOSIS — Z1379 Encounter for other screening for genetic and chromosomal anomalies: Secondary | ICD-10-CM | POA: Insufficient documentation

## 2017-04-26 ENCOUNTER — Encounter: Payer: Self-pay | Admitting: Radiation Oncology

## 2017-04-26 ENCOUNTER — Other Ambulatory Visit: Payer: Self-pay

## 2017-04-26 ENCOUNTER — Ambulatory Visit
Admission: RE | Admit: 2017-04-26 | Discharge: 2017-04-26 | Disposition: A | Discharge status: Home / Self Care | Payer: Managed Care, Other (non HMO) | Source: Ambulatory Visit | Attending: Radiation Oncology | Admitting: Radiation Oncology

## 2017-04-26 VITALS — BP 105/68 | HR 77 | Temp 97.4°F | Resp 20 | Wt 143.1 lb

## 2017-04-26 DIAGNOSIS — Z170 Estrogen receptor positive status [ER+]: Secondary | ICD-10-CM | POA: Insufficient documentation

## 2017-04-26 DIAGNOSIS — D0512 Intraductal carcinoma in situ of left breast: Secondary | ICD-10-CM | POA: Insufficient documentation

## 2017-04-26 DIAGNOSIS — Z79810 Long term (current) use of selective estrogen receptor modulators (SERMs): Secondary | ICD-10-CM | POA: Insufficient documentation

## 2017-04-26 DIAGNOSIS — Z923 Personal history of irradiation: Secondary | ICD-10-CM | POA: Insufficient documentation

## 2017-04-26 NOTE — Progress Notes (Signed)
Radiation Oncology Follow up Note  Name: Sherry Short   Date:   04/26/2017 MRN:  [id number] DOB: 1975-10-09    This 42 y.o. female presents to the clinic today for one-month follow-up status post whole breast radiation to her left breast for ductal carcinoma in situ. ER/PR positive  REFERRING PROVIDER: No ref. provider found  HPI: Patient is a 42 year old female now out 1 month having completed whole breast radiation to her left breast for ductal carcinoma in situ ER/PR positive seen today in routine follow-up she is doing well. She specifically denies breast tenderness cough or bone pain. She is currently on. Tamoxifen tolerating that well without side effect.  COMPLICATIONS OF TREATMENT: none  FOLLOW UP COMPLIANCE: keeps appointments   PHYSICAL EXAM:  BP 105/68   Pulse 77   Temp (!) 97.4 F (36.3 C)   Resp 20   Wt 143 lb 1.3 oz (64.9 kg)   BMI 26.17 kg/m  Lungs are clear to A&P cardiac examination essentially unremarkable with regular rate and rhythm. No dominant mass or nodularity is noted in either breast in 2 positions examined. Incision is well-healed. No axillary or supraclavicular adenopathy is appreciated. Cosmetic result is excellent. Still some type per pigmentation of the skin which is expected. Well-developed well-nourished patient in NAD. HEENT reveals PERLA, EOMI, discs not visualized.  Oral cavity is clear. No oral mucosal lesions are identified. Neck is clear without evidence of cervical or supraclavicular adenopathy. Lungs are clear to A&P. Cardiac examination is essentially unremarkable with regular rate and rhythm without murmur rub or thrill. Abdomen is benign with no organomegaly or masses noted. Motor sensory and DTR levels are equal and symmetric in the upper and lower extremities. Cranial nerves II through XII are grossly intact. Proprioception is intact. No peripheral adenopathy or edema is identified. No motor or sensory levels are noted. Crude visual fields  are within normal range.  RADIOLOGY RESULTS: No current films for review  PLAN: At the present time she is doing well 1 month out from whole breast radiation. I'm please were overall progress. I've asked to see her back in 4-5 months for follow-up. She continues on tamoxifen without side effect. She knows to call sooner with any concerns.  I would like to take this opportunity to thank you for allowing me to participate in the care of your patient.Noreene Filbert, MD

## 2017-05-01 ENCOUNTER — Other Ambulatory Visit: Payer: Medicaid Other

## 2017-05-05 ENCOUNTER — Telehealth: Payer: Self-pay | Admitting: General Surgery

## 2017-05-05 NOTE — Telephone Encounter (Signed)
Patients daughter is calling, said her mother is complaining of being in pain, pain level being an 8 or 9. Mother also stated she felt a lump in her left breast around the nipple. Please call patient and advise. Best number to be reached at [phone]

## 2017-05-05 NOTE — Telephone Encounter (Addendum)
I have spoke with the patient's daughter. She states that the patient was not able to sleep last night due to pain from her left breast. Patient stated that she noticed a lump on the right upper part of the nipple on the left breast. Patient had a lumpectomy on 01/02/17 with Dr Adonis Huguenin. She stated that the size of the lump is about the size of a quarter, it is slightly red that is warm to the touch. There is no drainage from the area or the nipple itself. No fever. The pain is constant through out the day. Patient did finish all treatments last month with no complications. Her last visit with Dr Donella Stade on 0/26/37 with no complications.   I have discussed the above with Dr Burt Knack and he feels that this could be from post treatment. He stated that the patient could put ice to the left breast and take ibuprofen till her follow up with Dr Adonis Huguenin on Monday 05/08/17. I did advise the patient's daughter to tell the patient if the pain becomes too severe. She agrees.

## 2017-05-08 ENCOUNTER — Encounter: Payer: Self-pay | Admitting: General Surgery

## 2017-05-08 ENCOUNTER — Ambulatory Visit (INDEPENDENT_AMBULATORY_CARE_PROVIDER_SITE_OTHER): Payer: Managed Care, Other (non HMO) | Admitting: General Surgery

## 2017-05-08 VITALS — BP 118/81 | HR 73 | Temp 98.3°F | Ht 62.0 in | Wt 141.0 lb

## 2017-05-08 DIAGNOSIS — N6320 Unspecified lump in the left breast, unspecified quadrant: Secondary | ICD-10-CM | POA: Diagnosis not present

## 2017-05-08 MED ORDER — TRAMADOL HCL 50 MG PO TABS: 50.0000 mg | ORAL_TABLET | Freq: Four times a day (QID) | ORAL | 0 refills | Status: DC | PRN

## 2017-05-08 NOTE — Addendum Note (Signed)
Addended by: Wayna Chalet on: 05/08/2017 09:28 AM   Modules accepted: Orders

## 2017-05-08 NOTE — Patient Instructions (Signed)
La vamos a llamar con su cita de ultrasonido y [place]. Luego la veremos para darle [employer].

## 2017-05-08 NOTE — Progress Notes (Signed)
Outpatient Surgical Follow Up  05/08/2017  Sherry Short is an 42 y.o. female.   Chief Complaint  Patient presents with  . Follow-up    Lumpectomy- 3 month follow up    HPI: 42 year old female who is well-known to the surgery department returns to clinic for 19-month follow-up from a left breast lumpectomy for DCIS.  Patient reports that last week she had a new onset of breast tenderness and pain with an associated new mass on the left breast.  It is on the opposite side of her nipple from her prior excisional site.  She reports she also has tenderness at her incision sites now but without any changes in how they feel.  She has had some feelings of being feverish but she has not been taking her temperature.  She denies any chills.  She denies any nausea, vomiting, chest pain, shortness of breath.  She has had constipation and no diarrhea.  Past Medical History:  Diagnosis Date  . Anxiety   . Cancer ([place]) 12/13/2016   Left Breast (DCIS)  . Migraine headache   . Seasonal allergies     Past Surgical History:  Procedure Laterality Date  . BREAST BIOPSY Left 11/30/2016   left breast calcs path pending  . BREAST BIOPSY Left 12/13/2016   Left Affrim Bx- Path pending  . BREAST BIOPSY Left 12/13/2016   Left Affirm Bx- Path pending  . BREAST LUMPECTOMY Left 01/02/2017   Needle localization and lymph node disection  . BREAST LUMPECTOMY WITH SENTINEL LYMPH NODE BIOPSY Left 01/02/2017   Procedure: BREAST LUMPECTOMY WITH SENTINEL LYMPH NODE BX;  Surgeon: Clayburn Pert, MD;  Location: [facility] ORS;  Service: General;  Laterality: Left;  . TUBAL LIGATION      Family History  Problem Relation Age of Onset  . Breast cancer Sister 59  . Healthy Mother   . Diabetes Father     Social History:  reports that  has never smoked. she has never used smokeless tobacco. She reports that she does not drink alcohol or use drugs.  Allergies: No Known Allergies  Medications reviewed.    ROS A  multipoint review of systems was completed, all pertinent positives and negatives are documented within the HPI and the remainder are negative   BP 118/81   Pulse 73   Temp 98.3 F (36.8 C) (Oral)   Ht 5\' 2"  (1.575 m)   Wt 64 kg (141 lb)   BMI 25.79 kg/m   Physical Exam General: No acute distress Neck: Supple nontender Breast: Right breast without any palpable abnormalities.  Left breast with obvious radiation changes.  Intact incisions in the infra-areolar and left axillary regions that are mildly tender to palpation but without any palpable abnormalities.  There is a 1 cm rubbery tender nodule at the 11 o'clock position just above the nipple areolar complex. Chest: Clear to auscultation Heart: Regular rate and rhythm Abdomen: Soft and nontender    No results found for this or any previous visit (from the past 48 hour(s)). No results found.  Assessment/Plan:  1. Left breast lump 42 year old female with a history of DCIS and a new tender rubbery left breast mass.  Given its acute onset discussed the likelihood of this being something benign is high.  However given the history of prior breast surgery for DCIS discussed that we should repeat her images and proceed as the imaging dictate.  Discussed that we would attempt to obtain images this week and have her follow-up in clinic  as soon as we have those results.  Otherwise I will give her a prescription for Ultram to assist with the breast tenderness.  A total of 15 minutes was used on this encounter with greater than 50% of it used for counseling or coordination of care.     Clayburn Pert, MD FACS General Surgeon  05/08/2017,9:21 AM

## 2017-05-17 ENCOUNTER — Other Ambulatory Visit: Payer: Self-pay | Admitting: General Surgery

## 2017-05-17 ENCOUNTER — Ambulatory Visit
Admission: RE | Admit: 2017-05-17 | Discharge: 2017-05-17 | Disposition: A | Discharge status: Home / Self Care | Payer: Managed Care, Other (non HMO) | Source: Ambulatory Visit | Attending: General Surgery | Admitting: General Surgery

## 2017-05-17 DIAGNOSIS — Z86000 Personal history of in-situ neoplasm of breast: Secondary | ICD-10-CM | POA: Insufficient documentation

## 2017-05-17 DIAGNOSIS — Z09 Encounter for follow-up examination after completed treatment for conditions other than malignant neoplasm: Secondary | ICD-10-CM | POA: Insufficient documentation

## 2017-05-17 DIAGNOSIS — Z9889 Other specified postprocedural states: Secondary | ICD-10-CM

## 2017-05-17 DIAGNOSIS — R921 Mammographic calcification found on diagnostic imaging of breast: Secondary | ICD-10-CM | POA: Diagnosis not present

## 2017-05-17 DIAGNOSIS — N6322 Unspecified lump in the left breast, upper inner quadrant: Secondary | ICD-10-CM | POA: Insufficient documentation

## 2017-05-17 DIAGNOSIS — R928 Other abnormal and inconclusive findings on diagnostic imaging of breast: Secondary | ICD-10-CM

## 2017-05-17 DIAGNOSIS — N6320 Unspecified lump in the left breast, unspecified quadrant: Secondary | ICD-10-CM

## 2017-05-17 HISTORY — DX: Personal history of irradiation: Z92.3

## 2017-05-25 ENCOUNTER — Encounter: Payer: Self-pay | Admitting: General Surgery

## 2017-05-25 ENCOUNTER — Ambulatory Visit (INDEPENDENT_AMBULATORY_CARE_PROVIDER_SITE_OTHER): Payer: Managed Care, Other (non HMO) | Admitting: General Surgery

## 2017-05-25 VITALS — BP 124/82 | HR 86 | Temp 97.6°F | Wt 138.0 lb

## 2017-05-25 DIAGNOSIS — N6320 Unspecified lump in the left breast, unspecified quadrant: Secondary | ICD-10-CM | POA: Diagnosis not present

## 2017-05-25 MED ORDER — LIDOCAINE-PRILOCAINE 2.5-2.5 % EX CREA: 1.0000 "application " | TOPICAL_CREAM | Freq: Four times a day (QID) | CUTANEOUS | 0 refills | Status: DC | PRN

## 2017-05-25 NOTE — Patient Instructions (Signed)
Por favor vaya a su farmacia y recoja su receta. Aplique la crema en el area donde le duele 6 veces al dia si es necesario.

## 2017-05-25 NOTE — Progress Notes (Signed)
Outpatient Surgical Follow Up  05/25/2017  Sherry Short is an 42 y.o. female.   Chief Complaint  Patient presents with  . Follow-up    Go over mammogram results    HPI: 42 year old female returns to clinic for follow-up from recent imaging performed of her left breast secondary to pain.  Patient has a personal history of DCIS to that breast and has already had resection and radiation.  Patient reports since being told there were new findings on images the pain has been more consistent.  She has not picked up or tried the medication that was prescribed during her last visit.  She states the pain is mostly around the nipple areolar complex and is tenderness to touch.  She states she has occasional clear drainage from the nipple but it is not compressible.  She denies any new masses or lumps.  She denies any fevers, chills, nausea, vomiting, chest pain, shortness of breath, diarrhea, constipation.  Past Medical History:  Diagnosis Date  . Anxiety   . Cancer ([place]) 12/13/2016   Left Breast (DCIS)  . Migraine headache   . Personal history of radiation therapy   . Seasonal allergies     Past Surgical History:  Procedure Laterality Date  . BREAST BIOPSY Left 11/30/2016   left breast calcs +  . BREAST BIOPSY Left 12/13/2016   Left Affrim Bx-+  . BREAST BIOPSY Left 12/13/2016   Left Affirm Bx- Path pending  . BREAST LUMPECTOMY Left 01/02/2017   Needle localization and lymph node disection  . BREAST LUMPECTOMY WITH SENTINEL LYMPH NODE BIOPSY Left 01/02/2017   Procedure: BREAST LUMPECTOMY WITH SENTINEL LYMPH NODE BX;  Surgeon: Clayburn Pert, MD;  Location: [facility] ORS;  Service: General;  Laterality: Left;  . TUBAL LIGATION      Family History  Problem Relation Age of Onset  . Breast cancer Sister 20  . Healthy Mother   . Diabetes Father     Social History:  reports that  has never smoked. she has never used smokeless tobacco. She reports that she does not drink alcohol or use  drugs.  Allergies: No Known Allergies  Medications reviewed.    ROS A multipoint review of systems was completed, all pertinent positives and negatives are documented within the HPI and the remainder are negative.   BP 124/82   Pulse 86   Temp 97.6 F (36.4 C) (Oral)   Wt 62.6 kg (138 lb)   LMP 05/07/2017   BMI 25.24 kg/m   Physical Exam General: No acute distress Neck: Supple nontender Breast: Left breast examined.  Well-healed left axillary incision site.  Infra areole her incision site is tender to palpation but without any palpable abnormality.  There is a palpable finding at the 11 o'clock position that is rubbery and mobile.  It is also tender.  No other findings of concern. Chest: Clear to auscultation Heart: Regular rate and rhythm Abdomen: Soft and nontender    No results found for this or any previous visit (from the past 48 hour(s)). No results found.  Assessment/Plan:  1. Left breast lump 42 year old female with left breast tenderness and new lumps that have been visualized.  Stereotactic biopsy was recommended due to the personal history of DCIS.  Discussed the plan in detail with the patient.  She will follow-up in clinic with my partner, Dr. Hampton Abbot, for pathology review of the week after her biopsy.  All questions answered to her satisfaction.  She has a prescription for Ultram that  she has not picked up for pain control.  I will also provide her with a prescription for topical lidocaine due to the tenderness of the nipple.  A total of 15 minutes was used on this encounter with greater than 50% of it used for counseling or coordination of care.   Clayburn Pert, MD FACS General Surgeon  05/25/2017,4:40 PM

## 2017-06-01 ENCOUNTER — Ambulatory Visit
Admission: RE | Admit: 2017-06-01 | Discharge: 2017-06-01 | Disposition: A | Discharge status: Home / Self Care | Payer: Managed Care, Other (non HMO) | Source: Ambulatory Visit | Attending: General Surgery | Admitting: General Surgery

## 2017-06-01 DIAGNOSIS — R928 Other abnormal and inconclusive findings on diagnostic imaging of breast: Secondary | ICD-10-CM

## 2017-06-01 DIAGNOSIS — Z923 Personal history of irradiation: Secondary | ICD-10-CM | POA: Diagnosis not present

## 2017-06-01 DIAGNOSIS — R921 Mammographic calcification found on diagnostic imaging of breast: Secondary | ICD-10-CM | POA: Diagnosis not present

## 2017-06-01 DIAGNOSIS — N6320 Unspecified lump in the left breast, unspecified quadrant: Secondary | ICD-10-CM

## 2017-06-01 HISTORY — PX: BREAST BIOPSY: SHX20

## 2017-06-04 NOTE — Progress Notes (Signed)
[place]  [phone] [phone]  ID: Sherry Short OB: 09-09-1975  MR#: [id number]  EFE#:[id number]  Patient Care Team: Patient, No Pcp Per as PCP - General (General Practice)  CHIEF COMPLAINT: Left breast DCIS.  INTERVAL HISTORY: Patient returns to clinic today for further evaluation and to assess her toleration of tamoxifen.  She had a recent breast biopsy was negative for malignancy. She currently feels well and is asymptomatic. She has no neurologic complaints. She denies any recent fevers or illnesses. She has a good appetite and denies weight loss. She denies any pain. She has no chest pain or shortness of breath. She denies any nausea, vomiting, constipation, or diarrhea. She has no urinary complaints. Patient offers no specific complaints today.  REVIEW OF SYSTEMS:   Review of Systems  Constitutional: Negative.  Negative for fever, malaise/fatigue and weight loss.  Respiratory: Negative.  Negative for cough and shortness of breath.   Cardiovascular: Negative.  Negative for chest pain and leg swelling.  Gastrointestinal: Negative.  Negative for abdominal pain.  Genitourinary: Negative.  Negative for dysuria.  Musculoskeletal: Negative.   Skin: Negative.  Negative for rash.  Neurological: Negative.  Negative for sensory change and weakness.  Psychiatric/Behavioral: Negative.  The patient is not nervous/anxious.     As per HPI. Otherwise, a complete review of systems is negative.  PAST MEDICAL HISTORY: Past Medical History:  Diagnosis Date  . Anxiety   . Cancer ([place]) 12/13/2016   Left Breast (DCIS)  . Migraine headache   . Personal history of radiation therapy   . Seasonal allergies     PAST SURGICAL HISTORY: Past Surgical History:  Procedure Laterality Date  . BREAST BIOPSY Left 11/30/2016   left breast calcs +  . BREAST BIOPSY Left 12/13/2016   Left Affrim Bx-+  . BREAST BIOPSY Left 12/13/2016   Left Affirm Bx- Path  pending  . BREAST BIOPSY Left 06/01/2017   Left Affirm- Path pending  . BREAST LUMPECTOMY Left 01/02/2017   Needle localization and lymph node disection  . BREAST LUMPECTOMY WITH SENTINEL LYMPH NODE BIOPSY Left 01/02/2017   Procedure: BREAST LUMPECTOMY WITH SENTINEL LYMPH NODE BX;  Surgeon: Clayburn Pert, MD;  Location: [facility] ORS;  Service: General;  Laterality: Left;  . TUBAL LIGATION      FAMILY HISTORY: Family History  Problem Relation Age of Onset  . Breast cancer Sister 28  . Healthy Mother   . Diabetes Father     ADVANCED DIRECTIVES (Y/N):  N  HEALTH MAINTENANCE: Social History   Tobacco Use  . Smoking status: Never Smoker  . Smokeless tobacco: Never Used  Substance Use Topics  . Alcohol use: No  . Drug use: No     Colonoscopy:  PAP:  Bone density:  Lipid panel:  No Known Allergies  Current Outpatient Medications  Medication Sig Dispense Refill  . acetaminophen (TYLENOL) 325 MG tablet Take 650 mg by mouth every 6 (six) hours as needed.    . tamoxifen (NOLVADEX) 20 MG tablet Take 1 tablet (20 mg total) by mouth daily. 90 tablet 3  . traMADol (ULTRAM) 50 MG tablet Take by mouth.    . gabapentin (NEURONTIN) 300 MG capsule Take 1 capsule (300 mg total) by mouth 3 (three) times daily. 90 capsule 0  . lidocaine-prilocaine (EMLA) cream Apply 1 application topically every 6 (six) hours as needed. (Patient not taking: Reported on 06/06/2017) 30 g 0   No current facility-administered medications for this visit.  OBJECTIVE: Vitals:   06/06/17 1403 06/06/17 1414  BP: 112/78 112/78  Pulse: 83 83  Resp: 18 18  Temp: 97.8 F (36.6 C) 97.8 F (36.6 C)     Body mass index is 25.81 kg/m.    ECOG FS:0 - Asymptomatic  General: Well-developed, well-nourished, no acute distress. Eyes: Pink conjunctiva, anicteric sclera. Breasts: Left breast with well-healed surgical scar.  Exam deferred today. Lungs: Clear to auscultation bilaterally. Heart: Regular rate and  rhythm. No rubs, murmurs, or gallops. Abdomen: Soft, nontender, nondistended. No organomegaly noted, normoactive bowel sounds. Musculoskeletal: No edema, cyanosis, or clubbing. Neuro: Alert, answering all questions appropriately. Cranial nerves grossly intact. Skin: No rashes or petechiae noted. Psych: Normal affect.   LAB RESULTS:  Lab Results  Component Value Date   NA 139 08/09/2015   K 4.1 08/09/2015   CL 110 08/09/2015   CO2 22 08/09/2015   GLUCOSE 98 08/09/2015   BUN 16 08/09/2015   CREATININE 1.02 (H) 08/09/2015   CALCIUM 9.2 08/09/2015   PROT 7.5 08/09/2015   ALBUMIN 4.3 08/09/2015   AST 18 08/09/2015   ALT 13 (L) 08/09/2015   ALKPHOS 68 08/09/2015   BILITOT 0.7 08/09/2015   GFRNONAA >60 08/09/2015   GFRAA >60 08/09/2015    Lab Results  Component Value Date   WBC 7.1 03/08/2017   HGB 12.9 03/08/2017   HCT 38.4 03/08/2017   MCV 85.9 03/08/2017   PLT 261 03/08/2017     STUDIES: US Breast Ltd Uni Left Inc Axilla  Result Date: 05/17/2017 CLINICAL DATA:  The patient is post lumpectomy for left breast lower inner quadrant high-grade DCIS. Of note, she had 2 other stereotactic biopsies, the results of which were deemed discordant, and re-biopsy or excisional biopsy was recommended. She also had an ultrasound-guided core needle biopsy of left breast 1 o'clock mass which demonstrated fibroepithelial lesion with a differential diagnosis including fibroadenoma or phyllodes tumor. 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 EXAM: 2D DIGITAL DIAGNOSTIC LEFT MAMMOGRAM WITH CAD AND ADJUNCT TOMO ULTRASOUND LEFT BREAST COMPARISON:  Previous exam(s). ACR Breast Density Category c: The breast tissue is heterogeneously dense, which may obscure small masses. FINDINGS: Mammographically, there are no suspicious masses or areas of nonsurgical architectural distortion. Expected postsurgical scarring is seen in the left breast lower inner quadrant, anterior depth, at the site of  excision of previously demonstrated DCIS, and upper outer quadrant, posterior depth, at the site of excision of a previously demonstrated fibroadenoma. There is a group of calcifications in the lower inner left breast, anterior depth, slightly more posterior to the first surgical site, and adjacent to a coil shaped marker. These calcifications demonstrate suspicious morphology and measure 0.8 x 0.8 x 0.9 cm. At the site of palpable concern in the upper inner left breast, anterior depth, there is a low-density 16 mm focal asymmetry. Mammographic images were processed with CAD. On physical exam, there is a moderately firm fixed palpable nodule in the left 11 o'clock breast. Targeted ultrasound is performed, showing left breast 11 o'clock, retroareolar location, isoechoic circumscribed mass measuring 1.9 x 2.1 x 1.1 cm. This finding corresponds to the area of palpable concern. There is no evidence of left axillary lymphadenopathy. IMPRESSION: Left breast lower inner quadrant, anterior depth, group of indeterminate to suspicious calcifications, for which stereotactic core needle biopsy is recommended. Left breast 11 o'clock palpable mass which may represent an area of fat necrosis. Given however the palpable nature of this abnormality, ultrasound-guided core needle biopsy is recommended. Postsurgical findings  from left breast lumpectomy/excisional biopsy in the left breast lower inner quadrant, anterior depth and upper outer quadrant, posterior depth. RECOMMENDATION: Stereotactic core needle biopsy of left breast lower inner quadrant calcifications. Ultrasound-guided core needle biopsy of left breast palpable 11 o'clock mass. I have discussed the findings and recommendations with the patient. Results were also provided in writing at the conclusion of the visit. If applicable, a reminder letter will be sent to the patient regarding the next appointment. BI-RADS CATEGORY  4: Suspicious. Electronically Signed   By:  Fidela Salisbury M.D.   On: 05/17/2017 12:30   Mm Diag Breast Tomo Uni Left  Result Date: 05/17/2017 CLINICAL DATA:  The patient is post lumpectomy for left breast lower inner quadrant high-grade DCIS. Of note, she had 2 other stereotactic biopsies, the results of which were deemed discordant, and re-biopsy or excisional biopsy was recommended. She also had an ultrasound-guided core needle biopsy of left breast 1 o'clock mass which demonstrated fibroepithelial lesion with a differential diagnosis including fibroadenoma or phyllodes tumor. 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 EXAM: 2D DIGITAL DIAGNOSTIC LEFT MAMMOGRAM WITH CAD AND ADJUNCT TOMO ULTRASOUND LEFT BREAST COMPARISON:  Previous exam(s). ACR Breast Density Category c: The breast tissue is heterogeneously dense, which may obscure small masses. FINDINGS: Mammographically, there are no suspicious masses or areas of nonsurgical architectural distortion. Expected postsurgical scarring is seen in the left breast lower inner quadrant, anterior depth, at the site of excision of previously demonstrated DCIS, and upper outer quadrant, posterior depth, at the site of excision of a previously demonstrated fibroadenoma. There is a group of calcifications in the lower inner left breast, anterior depth, slightly more posterior to the first surgical site, and adjacent to a coil shaped marker. These calcifications demonstrate suspicious morphology and measure 0.8 x 0.8 x 0.9 cm. At the site of palpable concern in the upper inner left breast, anterior depth, there is a low-density 16 mm focal asymmetry. Mammographic images were processed with CAD. On physical exam, there is a moderately firm fixed palpable nodule in the left 11 o'clock breast. Targeted ultrasound is performed, showing left breast 11 o'clock, retroareolar location, isoechoic circumscribed mass measuring 1.9 x 2.1 x 1.1 cm. This finding corresponds to the area of palpable concern.  There is no evidence of left axillary lymphadenopathy. IMPRESSION: Left breast lower inner quadrant, anterior depth, group of indeterminate to suspicious calcifications, for which stereotactic core needle biopsy is recommended. Left breast 11 o'clock palpable mass which may represent an area of fat necrosis. Given however the palpable nature of this abnormality, ultrasound-guided core needle biopsy is recommended. Postsurgical findings from left breast lumpectomy/excisional biopsy in the left breast lower inner quadrant, anterior depth and upper outer quadrant, posterior depth. RECOMMENDATION: Stereotactic core needle biopsy of left breast lower inner quadrant calcifications. Ultrasound-guided core needle biopsy of left breast palpable 11 o'clock mass. I have discussed the findings and recommendations with the patient. Results were also provided in writing at the conclusion of the visit. If applicable, a reminder letter will be sent to the patient regarding the next appointment. BI-RADS CATEGORY  4: Suspicious. Electronically Signed   By: Fidela Salisbury M.D.   On: 05/17/2017 12:30   Mm Clip Placement Left  Result Date: 06/01/2017 CLINICAL DATA:  Post biopsy mammogram of the left breast for clip placement. EXAM: DIAGNOSTIC LEFT MAMMOGRAM POST STEREOTACTIC BIOPSY COMPARISON:  Previous exam(s). FINDINGS: Mammographic images were obtained following stereotactic guided biopsy of calcifications in the lower-inner quadrant of the left breast.  The cylinder shaped biopsy marking clip is well positioned at the site of biopsy in the lower-inner left breast. IMPRESSION: Appropriate positioning of the cylinder shaped biopsy marking clip in the lower-inner left breast. Final Assessment: Post Procedure Mammograms for Marker Placement Electronically Signed   By: Ammie Ferrier M.D.   On: 06/01/2017 10:24   Mm Lt Breast Bx W Loc Dev 1st Lesion Image Bx Spec Stereo Guide  Addendum Date: 06/07/2017   ADDENDUM REPORT:  06/07/2017 16:20 ADDENDUM: Pathology of the left breast biopsy revealed A. BREAST, LEFT INNER QUADRANT; STEREOTACTIC BIOPSY: EPITHELIAL CHANGES CONSISTENT WITH HISTORY OF RADIATION THERAPY. PRIOR BIOPSY SITE CHANGE. CALCIFICATION ASSOCIATED WITH RADIATION EFFECT. FIBROADENOMATOID CHANGE. DEEPER SECTIONS WERE EXAMINED. Comment: The patient's prior excision containing DCIS ([id number]) was reviewed in conjunction with this case. In the area of prior biopsy site change some of the ducts are lined by a single layer of enlarged cells with enlarged nuclei and increased eosinophilic cytoplasm. These changes are characteristic of radiation effect and are morphologically dissimilar from the patient's prior DCIS. This was found to be concordant with Dr. Theda Sers' impression and notes. Recommendation: Annual diagnostic follow-up mammogram following lumpectomy. Per notes in Epic, results were discussed with the patient by Dr. Hampton Abbot on 06/06/17 during a follow-up office visit. Addendum by Jetta Lout, RRA. Electronically Signed   By: Ammie Ferrier M.D.   On: 06/07/2017 16:20   Result Date: 06/07/2017 CLINICAL DATA:  42 year old female presenting for stereotactic biopsy of left breast calcifications. EXAM: LEFT BREAST STEREOTACTIC CORE NEEDLE BIOPSY COMPARISON:  Previous exams. FINDINGS: Prior to starting the procedure, the patient states that she has some new erythema and tenderness of the skin under her left arm. Sheena, a nurse from the [facility] came to evaluate the patient and felt that the redness was most likely related to radiation affect. Patient's symptoms began on Friday, but have not progressed since then. The location of the redness is distant from the site of biopsy. Therefore we decided to proceed as planned. The patient and I discussed the procedure of stereotactic-guided biopsy including benefits and alternatives. We discussed the high likelihood of a successful procedure. We discussed the risks of the  procedure including infection, bleeding, tissue injury, clip migration, and inadequate sampling. Informed written consent was given. The usual time out protocol was performed immediately prior to the procedure. Using sterile technique and 1% Lidocaine as local anesthetic, under stereotactic guidance, a 9 gauge vacuum assisted device was used to perform core needle biopsy of calcifications in the lower-inner quadrant of the left breast using a medial approach. Specimen radiograph was performed showing calcifications within multiple core samples. Specimens with calcifications are identified for pathology. Lesion quadrant: Lower-inner quadrant At the conclusion of the procedure, a cylinder shaped tissue marker clip was deployed into the biopsy cavity. Follow-up 2-view mammogram was performed and dictated separately. IMPRESSION: 1. Stereotactic-guided biopsy of calcifications in the lower-inner left breast. No apparent complications. 2. An ultrasound-guided biopsy was initially recommended to follow the stereotactic biopsy. This procedure was canceled as review of the images demonstrate that the palpable lump is entirely fat containing, and located in the site of a recent surgical excision. This is consistent with benign fat necrosis, and no further procedures or follow-ups are necessary. Electronically Signed: By: Ammie Ferrier M.D. On: 06/01/2017 10:17    ASSESSMENT: Left breast DCIS.  PLAN:    1. Left breast DCIS: Patient underwent lumpectomy on January 02, 2017. Final pathology did not reveal an invasive component.  Patient completed adjuvant XRT on March 21, 2017.  Continue tamoxifen for a total of 5 years completing in December 2023.  Repeat mammogram in 6 months.  Return to clinic in 6 months for further evaluation.   2. Family history: Patient reports her sister had breast cancer at the age of 75 and was treated at [facility]. By report, her genetic testing was negative.  Patient's genetic testing is  negative. 3.  Left breast abnormality: Biopsy on June 01, 2017 was reported as fibroadenomatoid changes consistent with radiation therapy.  Approximately 20 minutes was spent in discussion of which greater than 50% was consultation.  The entire visit was done in the presence of an interpreter.  Patient expressed understanding and was in agreement with this plan. She also understands that She can call clinic at any time with any questions, concerns, or complaints.   Cancer Staging Ductal carcinoma in situ (DCIS) of left breast Staging form: Breast, AJCC 8th Edition - Clinical stage from 12/15/2016: Stage 0 (cTis (DCIS), cN0, cM0, ER: Positive, PR: Positive, HER2: Negative) - Signed by Lloyd Huger, MD on 12/15/2016 - Pathologic stage from 01/09/2017: Stage 0 (pTis (DCIS), pN0, cM0, ER: Positive, PR: Positive, HER2: Negative) - Signed by Lloyd Huger, MD on 01/09/2017   Lloyd Huger, MD   06/09/2017 11:33 AM

## 2017-06-05 LAB — SURGICAL PATHOLOGY

## 2017-06-06 ENCOUNTER — Ambulatory Visit (INDEPENDENT_AMBULATORY_CARE_PROVIDER_SITE_OTHER): Payer: Managed Care, Other (non HMO) | Admitting: Surgery

## 2017-06-06 ENCOUNTER — Inpatient Hospital Stay: Payer: Managed Care, Other (non HMO) | Attending: Oncology | Admitting: Oncology

## 2017-06-06 ENCOUNTER — Encounter: Payer: Self-pay | Admitting: Surgery

## 2017-06-06 ENCOUNTER — Other Ambulatory Visit: Payer: Self-pay

## 2017-06-06 ENCOUNTER — Ambulatory Visit: Payer: Self-pay | Admitting: Surgery

## 2017-06-06 VITALS — BP 112/78 | HR 83 | Temp 97.8°F | Resp 18 | Wt 141.1 lb

## 2017-06-06 VITALS — Ht 62.0 in

## 2017-06-06 DIAGNOSIS — Z79899 Other long term (current) drug therapy: Secondary | ICD-10-CM | POA: Insufficient documentation

## 2017-06-06 DIAGNOSIS — Z79811 Long term (current) use of aromatase inhibitors: Secondary | ICD-10-CM | POA: Diagnosis not present

## 2017-06-06 DIAGNOSIS — Z803 Family history of malignant neoplasm of breast: Secondary | ICD-10-CM | POA: Diagnosis not present

## 2017-06-06 DIAGNOSIS — N6320 Unspecified lump in the left breast, unspecified quadrant: Secondary | ICD-10-CM | POA: Diagnosis not present

## 2017-06-06 DIAGNOSIS — D0512 Intraductal carcinoma in situ of left breast: Secondary | ICD-10-CM | POA: Insufficient documentation

## 2017-06-06 MED ORDER — GABAPENTIN 300 MG PO CAPS: 300.0000 mg | ORAL_CAPSULE | Freq: Three times a day (TID) | ORAL | 0 refills | Status: DC

## 2017-06-06 NOTE — Progress Notes (Signed)
eval not completed-awaiting interpretor  1418 -completed w interpretor Luna Fuse

## 2017-06-06 NOTE — Patient Instructions (Signed)
Por favor trate de tomar gabapentin 300 MG tres veces al dia para el dolor.  La vamos a ver en tres semanas para ver si ha [employer] o no. Pero si el Dr. Baruch Gouty le resuelve el dolor pues la veremos hasta al principio de Septiembre con el Doctor Piscoya. Antes va a tener que hacerse un mamograma y [place].

## 2017-06-06 NOTE — Progress Notes (Signed)
06/06/2017  History of Present Illness: Sherry Short is a 42 y.o. female who presents for follow up of left breast pain and recent biopsy.  She has a history of left breast DCIS and had wire localized excision as well as sentinel node biopsy.  She has completed radiation with Dr. Baruch Gouty as well.  She presented to our office because about four weeks ago she started having left breast pain and felt a new mas on the left breast.  She had follow up studies and biopsy last week which resulted negative for malignancy or recurrence of her DCIS, but instead showed radiation changes and fibroadenomatoid change.  She has been given a prescription for Tramadol and also Emla cream for pain control, but she reports these have not been helping.  Denies any fevers, chills, worsening or new pain, new masses, or other concerns.  Of note, she points as her pain being towards the posterior axilla, the axillary incision site, the anterior superior breast, the lateral breast, and inferior portion of the breast underneath the nipple/areola complex.  Past Medical History: Past Medical History:  Diagnosis Date  . Anxiety   . Cancer ([place]) 12/13/2016   Left Breast (DCIS)  . Migraine headache   . Personal history of radiation therapy   . Seasonal allergies      Past Surgical History: Past Surgical History:  Procedure Laterality Date  . BREAST BIOPSY Left 11/30/2016   left breast calcs +  . BREAST BIOPSY Left 12/13/2016   Left Affrim Bx-+  . BREAST BIOPSY Left 12/13/2016   Left Affirm Bx- Path pending  . BREAST BIOPSY Left 06/01/2017   Left Affirm- Path pending  . BREAST LUMPECTOMY Left 01/02/2017   Needle localization and lymph node disection  . BREAST LUMPECTOMY WITH SENTINEL LYMPH NODE BIOPSY Left 01/02/2017   Procedure: BREAST LUMPECTOMY WITH SENTINEL LYMPH NODE BX;  Surgeon: Clayburn Pert, MD;  Location: [facility] ORS;  Service: General;  Laterality: Left;  . TUBAL LIGATION      Home  Medications: Prior to Admission medications   Medication Sig Start Date End Date Taking? Authorizing Provider  acetaminophen (TYLENOL) 325 MG tablet Take 650 mg by mouth every 6 (six) hours as needed.    [provider]  gabapentin (NEURONTIN) 300 MG capsule Take 1 capsule (300 mg total) by mouth 3 (three) times daily. 06/06/17   Olean Ree, MD  lidocaine-prilocaine (EMLA) cream Apply 1 application topically every 6 (six) hours as needed. Patient not taking: Reported on 06/06/2017 05/25/17   Clayburn Pert, MD  tamoxifen (NOLVADEX) 20 MG tablet Take 1 tablet (20 mg total) by mouth daily. 03/06/17   Lloyd Huger, MD  traMADol (ULTRAM) 50 MG tablet Take by mouth. 05/26/17   [provider]    Allergies: No Known Allergies  Review of Systems: Review of Systems  Constitutional: Negative for chills and fever.  Respiratory: Negative for shortness of breath.   Cardiovascular: Negative for chest pain.  Gastrointestinal: Negative for abdominal pain, nausea and vomiting.  Skin: Negative for rash.    Physical Exam Ht 5\' 2"  (1.575 m)   LMP 05/07/2017   BMI 25.81 kg/m  CONSTITUTIONAL: No acute distress RESPIRATORY:  Lungs are clear, and breath sounds are equal bilaterally. Normal respiratory effort without pathologic use of accessory muscles. CARDIOVASCULAR: Heart is regular without murmurs, gallops, or rubs. BREAST:  All incisions are healed well and there is no evidence of infection.  Biopsy site with bandaid in place.  There is mild  discomfort to palpation over multiple areas of the left chest/breast as mentioned above, but no clear pattern. GI: The abdomen is soft, nondistended, nontender.  NEUROLOGIC:  Motor and sensation is grossly normal.  Cranial nerves are grossly intact. PSYCH:  Alert and oriented to person, place and time. Affect is normal.  Labs/Imaging: Pathology report 2/21: - EPITHELIAL CHANGES CONSISTENT WITH HISTORY OF RADIATION THERAPY.  - PRIOR  BIOPSY SITE CHANGE.  - CALCIFICATION ASSOCIATED WITH RADIATION EFFECT.  - FIBROADENOMATOID CHANGE.   Assessment and Plan: This is a 42 y.o. female who presents for follow up on left breast pain, s/p wire localized lumpectomy and sentinel node biopsy last year for DCIS.  Discussed with patient negative pathology results.  Patient was reassured and she appeared more comfortable afterwards.  The concern is more now related to the persistent left breast/chest pain.  She is now 5 months out of her surgery and her pain did not start right after surgery but just 3-4 weeks ago.  She has finished radiation treatment for her DCIS about two months ago.  It is unclear the etiology of her pain, but could be related to radiation changes.  Her prior prescription for Tramadol did not help.  Will start her on a new prescription for Neurontin to see if this helps with any type of nerve related pain.  Will also have her see Dr. Baruch Gouty to get his input on the possible reasons for her pain.  She will follow up with [place] in about 3 weeks.  Given that she just had a mammogram and ultrasound, will postpone her follow up studies and appointment for 6 months.  Patient understands this plan and all of her questions have been answered.  Face-to-face time spent with the patient and care providers was 25 minutes, with more than 50% of the time spent counseling, educating, and coordinating care of the patient.     Melvyn Neth, [place]

## 2017-06-14 ENCOUNTER — Ambulatory Visit: Payer: Managed Care, Other (non HMO) | Admitting: Radiation Oncology

## 2017-06-19 ENCOUNTER — Other Ambulatory Visit: Payer: Self-pay

## 2017-06-19 ENCOUNTER — Encounter: Payer: Self-pay | Admitting: Radiation Oncology

## 2017-06-19 ENCOUNTER — Ambulatory Visit
Admission: RE | Admit: 2017-06-19 | Discharge: 2017-06-19 | Disposition: A | Discharge status: Home / Self Care | Payer: Managed Care, Other (non HMO) | Source: Ambulatory Visit | Attending: Radiation Oncology | Admitting: Radiation Oncology

## 2017-06-19 VITALS — BP 106/71 | HR 86 | Temp 98.4°F | Resp 18 | Wt 140.3 lb

## 2017-06-19 DIAGNOSIS — Z923 Personal history of irradiation: Secondary | ICD-10-CM | POA: Insufficient documentation

## 2017-06-19 DIAGNOSIS — M79622 Pain in left upper arm: Secondary | ICD-10-CM | POA: Insufficient documentation

## 2017-06-19 DIAGNOSIS — Z170 Estrogen receptor positive status [ER+]: Secondary | ICD-10-CM | POA: Diagnosis not present

## 2017-06-19 DIAGNOSIS — D0512 Intraductal carcinoma in situ of left breast: Secondary | ICD-10-CM

## 2017-06-19 NOTE — Progress Notes (Signed)
Radiation Oncology Follow up Note  Name: Sherry Short   Date:   06/19/2017 MRN:  [id number] DOB: [birth year]    This 42 y.o. female presents to the clinic today for four-month follow-up status post whole breast radiation to her left breast for ER/PR positive ductal carcinoma in situ now with left axillary pain.  REFERRING PROVIDER: No ref. provider found  HPI: Patient is a 42 year old Spanish female now seen in follow-up 4 months since completion of whole breast radiation to her left breast for ductal carcinoma in situ ER/PR positive. She did have sentinel node biopsies also. I saw her back in January and she was doing fine. She recently saw surgeon when she started having left breast pain more accentuated in the left axilla. She did have a repeat biopsy showing no evidence of malignancy or DCIS. She is seen today for my opinion. She seems to be doing better. Her pain seems to be centered squarely in the left axilla area that was not incorporated in her radiation field. She otherwise is without complaint.  COMPLICATIONS OF TREATMENT: present  FOLLOW UP COMPLIANCE: keeps appointments   PHYSICAL EXAM:  BP 106/71   Pulse 86   Temp 98.4 F (36.9 C)   Resp 18   Wt 140 lb 5.2 oz (63.6 kg)   BMI 25.67 kg/m  Lungs are clear to A&P cardiac examination essentially unremarkable with regular rate and rhythm. No dominant mass or nodularity is noted in either breast in 2 positions examined. Incision is well-healed. No axillary or supraclavicular adenopathy is appreciated. Cosmetic result is excellent. The wrist tenderness on palpation of her left axilla no mass or nodularity is noted. Well-developed well-nourished patient in NAD. HEENT reveals PERLA, EOMI, discs not visualized.  Oral cavity is clear. No oral mucosal lesions are identified. Neck is clear without evidence of cervical or supraclavicular adenopathy. Lungs are clear to A&P. Cardiac examination is essentially unremarkable with regular rate  and rhythm without murmur rub or thrill. Abdomen is benign with no organomegaly or masses noted. Motor sensory and DTR levels are equal and symmetric in the upper and lower extremities. Cranial nerves II through XII are grossly intact. Proprioception is intact. No peripheral adenopathy or edema is identified. No motor or sensory levels are noted. Crude visual fields are within normal range.  RADIOLOGY RESULTS: Previous mammograms and ultrasound reviewed  PLAN: At this time I believe were dealing with scar tissue in her left axilla most likely from her sentinel node biopsy. I've assured her this will dissipate over time. I have suggested she exercises the arm to break up some of the scar tissue. She does seem to have a low pain threshold. I've asked to see her back in 3 months for follow-up. If pain persists would get a CT scan of her chest with contrast for evaluation. Patient seemed comfortable with my recommendations. Will follow-up with surgeon.  I would like to take this opportunity to thank you for allowing me to participate in the care of your patient.Noreene Filbert, MD

## 2017-06-23 DIAGNOSIS — R102 Pelvic and perineal pain: Secondary | ICD-10-CM | POA: Insufficient documentation

## 2017-06-23 DIAGNOSIS — D251 Intramural leiomyoma of uterus: Secondary | ICD-10-CM | POA: Insufficient documentation

## 2017-06-27 DIAGNOSIS — R7303 Prediabetes: Secondary | ICD-10-CM | POA: Insufficient documentation

## 2017-06-28 ENCOUNTER — Ambulatory Visit (INDEPENDENT_AMBULATORY_CARE_PROVIDER_SITE_OTHER): Payer: Managed Care, Other (non HMO) | Admitting: Surgery

## 2017-06-28 ENCOUNTER — Other Ambulatory Visit: Payer: Medicaid Other

## 2017-06-28 ENCOUNTER — Encounter: Payer: Self-pay | Admitting: Surgery

## 2017-06-28 VITALS — BP 124/77 | HR 84 | Temp 98.3°F | Wt 140.0 lb

## 2017-06-28 DIAGNOSIS — M79622 Pain in left upper arm: Secondary | ICD-10-CM

## 2017-06-28 DIAGNOSIS — N644 Mastodynia: Secondary | ICD-10-CM | POA: Diagnosis not present

## 2017-06-28 DIAGNOSIS — E785 Hyperlipidemia, unspecified: Secondary | ICD-10-CM | POA: Insufficient documentation

## 2017-06-28 NOTE — Progress Notes (Signed)
06/28/2017  History of Present Illness: Sherry Short is a 42 y.o. female who presents for follow up for left axillary and breast pain s/p left breast lumpectomy and sentinel node biopsy on 01/02/17 with Dr. Adonis Huguenin.  She saw Dr. Baruch Gouty on 3/11, who did not think her pain was related to radiation changes.  She was given a prescription for neurontin by me on 2/26.  She notes that this medication has not really helped her pain symptoms.  She reports her pain is the same and has not improved or worsened.  Denies any radiation of her pain, and reports it's mostly focused on the posterior axillary line that is constant, and has some lateral left breast pain when she presses.  Past Medical History: Past Medical History:  Diagnosis Date  . Anxiety   . Cancer ([place]) 12/13/2016   Left Breast (DCIS)  . Migraine headache   . Personal history of radiation therapy   . Seasonal allergies      Past Surgical History: Past Surgical History:  Procedure Laterality Date  . BREAST BIOPSY Left 11/30/2016   left breast calcs +  . BREAST BIOPSY Left 12/13/2016   Left Affrim Bx-+  . BREAST BIOPSY Left 12/13/2016   Left Affirm Bx- Path pending  . BREAST BIOPSY Left 06/01/2017   Left Affirm- Path pending  . BREAST LUMPECTOMY Left 01/02/2017   Needle localization and lymph node disection  . BREAST LUMPECTOMY WITH SENTINEL LYMPH NODE BIOPSY Left 01/02/2017   Procedure: BREAST LUMPECTOMY WITH SENTINEL LYMPH NODE BX;  Surgeon: Clayburn Pert, MD;  Location: [facility] ORS;  Service: General;  Laterality: Left;  . TUBAL LIGATION      Home Medications: Prior to Admission medications   Medication Sig Start Date End Date Taking? Authorizing Provider  acetaminophen (TYLENOL) 325 MG tablet Take 650 mg by mouth every 6 (six) hours as needed.    [provider]  gabapentin (NEURONTIN) 300 MG capsule Take 1 capsule (300 mg total) by mouth 3 (three) times daily. 06/06/17   Olean Ree, MD   HYDROcodone-acetaminophen (NORCO/VICODIN) 5-325 MG tablet take 1 tablet by mouth every 6 hours if needed for pain 06/15/17   [provider]  lidocaine-prilocaine (EMLA) cream Apply 1 application topically every 6 (six) hours as needed. Patient not taking: Reported on 06/06/2017 05/25/17   Clayburn Pert, MD  tamoxifen (NOLVADEX) 20 MG tablet Take 1 tablet (20 mg total) by mouth daily. 03/06/17   Lloyd Huger, MD  traMADol (ULTRAM) 50 MG tablet Take by mouth. 05/26/17   [provider]    Allergies: No Known Allergies  Review of Systems: Review of Systems  Constitutional: Negative for chills and fever.  Respiratory: Negative for shortness of breath.   Cardiovascular: Negative for chest pain.  Gastrointestinal: Negative for nausea and vomiting.  Skin: Negative for itching and rash.    Physical Exam BP 124/77   Pulse 84   Temp 98.3 F (36.8 C) (Oral)   Wt 63.5 kg (140 lb)   BMI 25.61 kg/m  CONSTITUTIONAL: No acute distress BREAST:  Left axilla examined.  Sentinel node biopsy scar is well healed.  There is tenderness to palpation over the posterior axillary line, and only some discomfort over the axilla itself or over the scar.  There is some discomfort to palpation of the lateral left breast, but there are no palpable masses, erythema, induration.  Prior lumpectomy site also healed well. GI: The abdomen is soft, nondistended, nontender.  MUSCULOSKELETAL:  Normal muscle  strength and tone in all four extremities.  No peripheral edema or cyanosis. SKIN: Skin turgor is normal. There are no pathologic skin lesions.  NEUROLOGIC:  Motor and sensation is grossly normal.  Cranial nerves are grossly intact. PSYCH:  Alert and oriented to person, place and time. Affect is normal.  Labs/Imaging: None recently  Assessment and Plan: This is a 42 y.o. female who presents for follow up of left axillary and breast pain.  The pain that the patient is having is along the  posterior axillary line rather than the incision itself.  There is discomfort when pressing along the lateral aspect of the left breast as well.  This could be related to nerve irritation or scarring after surgery, but the etiology is unclear.  The neurontin has not helped.  Recommended that she try the Emla cream over the axillary pain area to see if that helps.  This could be nerve irritation post-op from scarring, and if so, this should improve with time.  Agree with Dr. Baruch Gouty that if there is no improvement in a few months, getting a CT scan would be reasonable.  She will follow up with Dr. Baruch Gouty in June and with [place] with a repeat mammogram/ultrasound in August.  Face-to-face time spent with the patient and care providers was 25 minutes, with more than 50% of the time spent counseling, educating, and coordinating care of the patient.     Melvyn Neth, [place]

## 2017-06-28 NOTE — Patient Instructions (Signed)
La veremos despues de su mamograma y [place].  Si tiene [employer], por favor dejenos saber.

## 2017-07-05 ENCOUNTER — Ambulatory Visit: Payer: Self-pay | Admitting: General Surgery

## 2017-08-09 DIAGNOSIS — G43909 Migraine, unspecified, not intractable, without status migrainosus: Secondary | ICD-10-CM

## 2017-08-09 HISTORY — DX: Migraine, unspecified, not intractable, without status migrainosus: G43.909

## 2017-09-07 NOTE — H&P (Signed)
Sherry Short is a 42 y.o. female here for LAVH , bilateral salpingectomy   Pt  Has a  with a history  of constant lower abdominal pain . + dyspareunia . Menses regular .  Marland Kitchen She was recently diagnosed with Left DCIS And is s/p lumpectomy and XRT . She is on ? femara ? Tamoxifen  No constipation   Past Medical History:  has a past medical history of Breast cancer , unspecified (CMS-HCC) (12/2016), Cancer (CMS-HCC) (12/2016), Hyperlipidemia, unspecified, Intramural leiomyoma of uterus (06/23/2017), and Prediabetes (06/27/2017).  Past Surgical History:  has a past surgical history that includes Tubal ligation and Breast surgery (Left, 12/2016). Family History: family history includes Breast cancer in her sister; Diabetes type II in her father; No Known Problems in her mother. Social History:  reports that she has never smoked. She has never used smokeless tobacco. She reports that she does not drink alcohol or use drugs. OB/GYN History:          OB History    Gravida  5   Para  4   Term      Preterm      AB  1   Living  4     SAB      TAB      Ectopic      Molar      Multiple      Live Births  4          Allergies: has No Known Allergies. Medications:  Current Outpatient Medications:  .  gabapentin (NEURONTIN) 300 MG capsule, Take by mouth, Disp: , Rfl:  .  tamoxifen (NOLVADEX) 20 MG tablet, Take by mouth once daily, Disp: , Rfl:   Review of Systems: General:                      No fatigue or weight loss Eyes:                           No vision changes Ears:                            No hearing difficulty Respiratory:                No cough or shortness of breath Pulmonary:                  No asthma or shortness of breath Cardiovascular:           No chest pain, palpitations, dyspnea on exertion Gastrointestinal:          No abdominal bloating, chronic diarrhea, constipations, masses, pain or hematochezia Genitourinary:             No hematuria,  dysuria, abnormal vaginal discharge, pelvic pain, Menometrorrhagia Lymphatic:                   No swollen lymph nodes Musculoskeletal:         No muscle weakness Neurologic:                  No extremity weakness, syncope, seizure disorder Psychiatric:                  No history of depression, delusions or suicidal/homicidal ideation    Exam:      Vitals:   09/07/17   BP: 112/76  Pulse: 78  Body mass index is 25.13 kg/m.  WDWN white/  female in NAD   Lungs: CTA  CV : RRR without murmur    Neck:  no thyromegaly Abdomen: soft , no mass, normal active bowel sounds,  non-tender, no rebound tenderness Pelvic: tanner stage 5 ,  External genitalia: vulva /labia no lesions Urethra: no prolapse Vagina: normal physiologic d/c, adequate room for TVH  Cervix: no lesions, no cervical motion tenderness   Uterus: 8 weeks in  Size, irregular shape and contour, non-tender Adnexa: no mass,  non-tender   Rectovaginal:  U/s today  2 fibroids 5cm + 2 cm  Benign appearing small bilateral ovarian cyst   Impression:   The primary encounter diagnosis was Pelvic pain in female. A diagnosis of Intramural leiomyoma of uterus was also pertinent to this visit.    Plan:  I have spoken with the patient regarding treatment options including expectant management, hormonal options, or surgical intervention. After a full discussion the pt elects to proceed with LAVH  and bilateral salpingectomy  the risks of the procedure has been reviewed with her via a Optometrist . All questions have been answered      .  Caroline Sauger, MD

## 2017-09-11 ENCOUNTER — Encounter
Admission: RE | Admit: 2017-09-11 | Discharge: 2017-09-11 | Disposition: A | Discharge status: Home / Self Care | Payer: Managed Care, Other (non HMO) | Source: Ambulatory Visit | Attending: Obstetrics and Gynecology | Admitting: Obstetrics and Gynecology

## 2017-09-11 ENCOUNTER — Other Ambulatory Visit: Payer: Self-pay

## 2017-09-11 DIAGNOSIS — Z01818 Encounter for other preprocedural examination: Secondary | ICD-10-CM | POA: Insufficient documentation

## 2017-09-11 HISTORY — DX: Angina pectoris, unspecified: I20.9

## 2017-09-11 LAB — BASIC METABOLIC PANEL
Anion gap: 6 (ref 5–15)
BUN: 13 mg/dL (ref 6–20)
CALCIUM: 8.8 mg/dL — AB (ref 8.9–10.3)
CHLORIDE: 107 mmol/L (ref 101–111)
CO2: 24 mmol/L (ref 22–32)
CREATININE: 0.74 mg/dL (ref 0.44–1.00)
GFR calc Af Amer: >60 mL/min (ref >60)
GFR calc non Af Amer: >60 mL/min (ref >60)
GLUCOSE: 103 mg/dL — AB (ref 65–99)
Potassium: 4.1 mmol/L (ref 3.5–5.1)
Sodium: 137 mmol/L (ref 135–145)

## 2017-09-11 LAB — CBC
HCT: 38.3 % (ref 35.0–47.0)
Hemoglobin: 13 g/dL (ref 12.0–16.0)
MCH: 30.2 pg (ref 26.0–34.0)
MCHC: 33.8 g/dL (ref 32.0–36.0)
MCV: 89.2 fL (ref 80.0–100.0)
PLATELETS: 248 10*3/uL (ref 150–440)
RBC: 4.29 MIL/uL (ref 3.80–5.20)
RDW: 13.3 % (ref 11.5–14.5)
WBC: 7.9 10*3/uL (ref 3.6–11.0)

## 2017-09-11 LAB — TYPE AND SCREEN
ABO/RH(D): A POS
Antibody Screen: NEGATIVE

## 2017-09-11 NOTE — Pre-Procedure Instructions (Signed)
Instructed patient via interpreter, [place], regarding use of Fleets Enema on morning of surgery; surgical scrub soap and the incentive spirometer. Patient was able to perform a return demonstration correctly on the inspirometer. She will use this after surgery. Also, reviewed TCDB exercises. She appears to understand all information provided.

## 2017-09-11 NOTE — Patient Instructions (Signed)
Your procedure is scheduled on: Monday, September 18, 2017 Su procedimiento est programado para:  Report to [place]. DO NOT STOP ON THE FIRST FLOOR. Presntese a: To find out your arrival time please call [phone] between 1PM - 3PM on Friday, June 7,2019 Para saber su hora de llegada por favor llame al [phone] entre la 1PM - 3PM el da:  Remember: Instructions that are not followed completely may result in serious medical risk,  up to and including death, or upon the discretion of your surgeon and anesthesiologist your  surgery may need to be rescheduled.  Recuerde: Las instrucciones que no se siguen completamente Heritage manager en un riesgo de salud grave,  incluyendo hasta la [place] o a discrecin de su cirujano y Environmental health practitioner, su ciruga se puede posponer .   __X_ 1.Do not eat food after midnight the night before your procedure.                            No gum chewing or hard candies.                                     You may drink clear liquids up to 2 hours before you are scheduled to arrive for your surgery-                                        DO not drink clear liquids within 2 hours of the start of your surgery.     Clear Liquids include: water, apple juice without pulp, clear carbohydrate drink such as      Clearfast of Gatorade, Black Coffee or Tea (Do not add anything to coffee or tea).      No coma nada despus de la medianoche de la noche anterior a su procedimiento.                               No coma chicles ni caramelos duros. Puede tomar lquidos claros hasta 2 horas antes de                                  su hora programada de llegada al hospital para su procedimiento. No tome lquidos claros                                      durante el transcurso de las 2 horas de su llegada programada al hospital para su                 procedimiento, ya que esto puede llevar a que su procedimiento se retrase o tenga que                           volver a Health and safety inspector.  Los lquidos claros incluyen:         - Agua o jugo de [place] sin pulpa         - Bebidas claras con carbohidratos como ClearFast o Gatorade         -  Caf negro o t claro (sin leche, sin cremas, no agregue nada al caf ni al  t)  No tome nada que no est en esta lista.  Los pacientes con diabetes tipo 1 y tipo 2 solo deben Agricultural engineer.  Llame a la clnica de PreCare o a la unidad de Same Day Surgery si    tiene alguna pregunta sobre estas instrucciones.              _X__ 2.Do Not Smoke or use e-cigarettes For 24 Hours Prior to Your Surgery.                             Do not use any chewable tobacco products for at least 6 hours prior to surgery.    No fume ni use cigarrillos electrnicos durante las 24 horas previas a su [place].                               No use ningn producto de tabaco masticable durante al menos 6 horas antes de la [place].     __X_ 3. No alcohol for 24 hours before or after surgery.    No tome alcohol durante las 24 horas antes ni despus de la [place].   ____4. Bring all medications with you on the day of surgery if instructed.    Lleve todos los medicamentos con usted el da de su ciruga si se le ha indicado as.   ____ 5. Notify your doctor if there is any change in your medical condition (cold,fever, infections).    Informe a su mdico si hay algn cambio en su condicin mdica (resfriado, fiebre, infecciones).   Do not wear jewelry, make-up, hairpins, clips or nail polish.  No use joyas, maquillajes, pinzas/ganchos para el cabello ni esmalte de uas.  Do not wear lotions, powders, or perfumes. You may wear deodorant.  No use lociones, polvos o perfumes.  Puede usar desodorante.    Do not shave 48 hours prior to surgery. Men may shave face and neck.  No se afeite 48 horas antes de la [place].  Los hombres pueden [employer] cara  y el cuello.   Do not bring valuables to the hospital.   No lleve objetos  [facility] is not responsible for any belongings or valuables.  [employer] no se hace responsable de ningn tipo de pertenencias uobjetos de Geographical information systems officer.               Contacts, dentures or bridgework may not be worn into surgery.  Los lentes de [place], las dentaduras postizas o puentes no se pueden usar en la [place].  Leave your suitcase in the car. After surgery it may be brought to your room.  Deje su maleta en el auto.  Despus de la ciruga podr traerla a su habitacin.  For patients admitted to the hospital, discharge time is determined by your treatment team.  Para los pacientes que sean ingresados al hospital, el tiempo en el cual se le dar de alta               es determinado por su equipo de [place].   Patients discharged the day of surgery will not be allowed to drive home. A los pacientes que se les da de alta el mismo da de la ciruga no se les permitir conducir a Holiday representative.  Please read over the following fact sheets that you were given: Por favor [place] informacin que le dieron:   PREPARING FOR SURGERY    ____ Take these medicines the morning of surgery with A SIP OF WATER:          [employer] estas medicinas la maana de la ciruga con UN SORBO DE AGUA:  1. TAMOXIFEN  2.   3.   4.       5.  6.  _X___ Fleet Enema (as directed)          Enema de Fleet (segn lo indicado)    __X__ Use CHG Soap as directed          Utilice el jabn de CHG segn lo indicado  ____ Use inhalers on the day of surgery          Use los inhaladores el da de la ciruga  ____ Stop ALL ASPIRIN PRODUCTS TODAY          Deje de tomar el Coumadin/Plavix/aspirina el da:  ____ Stop Anti-inflammatories AS OF TODAY          Deje de tomar antiinflamatorios el da:   _X___ Stop supplements until after surgery            Deje de tomar suplementos hasta despus de la ciruga               THIS INCLUDES VITAMIN E  ____ Bring C-Pap to the hospital           [place] USE  WEAR SOMETHING LOOSE AND COMFORTABLE TO [place].

## 2017-09-18 ENCOUNTER — Encounter
Admission: RE | Disposition: A | Discharge status: Home / Self Care | Payer: Self-pay | Source: Ambulatory Visit | Attending: Obstetrics and Gynecology

## 2017-09-18 ENCOUNTER — Ambulatory Visit: Payer: Managed Care, Other (non HMO) | Admitting: Certified Registered Nurse Anesthetist

## 2017-09-18 ENCOUNTER — Other Ambulatory Visit: Payer: Self-pay

## 2017-09-18 ENCOUNTER — Encounter: Payer: Self-pay | Admitting: *Deleted

## 2017-09-18 ENCOUNTER — Observation Stay
Admission: RE | Admit: 2017-09-18 | Discharge: 2017-09-19 | Disposition: A | Discharge status: Home / Self Care | Payer: Managed Care, Other (non HMO) | Source: Ambulatory Visit | Attending: Obstetrics and Gynecology | Admitting: Obstetrics and Gynecology

## 2017-09-18 DIAGNOSIS — Z853 Personal history of malignant neoplasm of breast: Secondary | ICD-10-CM | POA: Insufficient documentation

## 2017-09-18 DIAGNOSIS — N941 Unspecified dyspareunia: Secondary | ICD-10-CM | POA: Insufficient documentation

## 2017-09-18 DIAGNOSIS — Z9851 Tubal ligation status: Secondary | ICD-10-CM | POA: Insufficient documentation

## 2017-09-18 DIAGNOSIS — R102 Pelvic and perineal pain: Secondary | ICD-10-CM | POA: Diagnosis not present

## 2017-09-18 DIAGNOSIS — D251 Intramural leiomyoma of uterus: Secondary | ICD-10-CM | POA: Diagnosis not present

## 2017-09-18 DIAGNOSIS — N736 Female pelvic peritoneal adhesions (postinfective): Secondary | ICD-10-CM | POA: Insufficient documentation

## 2017-09-18 DIAGNOSIS — Z9889 Other specified postprocedural states: Secondary | ICD-10-CM

## 2017-09-18 DIAGNOSIS — Z79810 Long term (current) use of selective estrogen receptor modulators (SERMs): Secondary | ICD-10-CM | POA: Diagnosis not present

## 2017-09-18 DIAGNOSIS — N8312 Corpus luteum cyst of left ovary: Secondary | ICD-10-CM | POA: Diagnosis not present

## 2017-09-18 DIAGNOSIS — N838 Other noninflammatory disorders of ovary, fallopian tube and broad ligament: Secondary | ICD-10-CM | POA: Insufficient documentation

## 2017-09-18 DIAGNOSIS — Z79899 Other long term (current) drug therapy: Secondary | ICD-10-CM | POA: Insufficient documentation

## 2017-09-18 DIAGNOSIS — G8929 Other chronic pain: Principal | ICD-10-CM | POA: Insufficient documentation

## 2017-09-18 DIAGNOSIS — N72 Inflammatory disease of cervix uteri: Secondary | ICD-10-CM | POA: Insufficient documentation

## 2017-09-18 DIAGNOSIS — N800 Endometriosis of uterus: Secondary | ICD-10-CM | POA: Insufficient documentation

## 2017-09-18 HISTORY — PX: LAPAROSCOPIC OVARIAN CYSTECTOMY: SHX6248

## 2017-09-18 HISTORY — PX: LAPAROSCOPIC VAGINAL HYSTERECTOMY WITH SALPINGECTOMY: SHX6680

## 2017-09-18 LAB — ABO/RH: ABO/RH(D): A POS

## 2017-09-18 LAB — POCT PREGNANCY, URINE: Preg Test, Ur: NEGATIVE

## 2017-09-18 SURGERY — HYSTERECTOMY, VAGINAL, LAPAROSCOPY-ASSISTED, WITH SALPINGECTOMY
Anesthesia: General | Site: Abdomen | Laterality: Left | Wound class: Clean Contaminated

## 2017-09-18 MED ORDER — LIDOCAINE-EPINEPHRINE 1 %-1:100000 IJ SOLN
INTRAMUSCULAR | Status: DC | PRN
  Administered 2017-09-18: 8 mL

## 2017-09-18 MED ORDER — MORPHINE SULFATE 2 MG/ML IV SOLN
INTRAVENOUS | Status: DC
  Administered 2017-09-18: 19:00:00 via INTRAVENOUS
  Administered 2017-09-18: 4.48 mg via INTRAVENOUS
  Administered 2017-09-19: 5.2 mg via INTRAVENOUS
  Filled 2017-09-18: qty 30

## 2017-09-18 MED ORDER — FENTANYL CITRATE (PF) 100 MCG/2ML IJ SOLN
INTRAMUSCULAR | Status: AC
  Filled 2017-09-18: qty 2

## 2017-09-18 MED ORDER — ACETAMINOPHEN 10 MG/ML IV SOLN
INTRAVENOUS | Status: AC
  Filled 2017-09-18: qty 100

## 2017-09-18 MED ORDER — LIDOCAINE HCL (CARDIAC) PF 100 MG/5ML IV SOSY
PREFILLED_SYRINGE | INTRAVENOUS | Status: DC | PRN
  Administered 2017-09-18: 60 mg via INTRAVENOUS

## 2017-09-18 MED ORDER — OXYCODONE-ACETAMINOPHEN 5-325 MG PO TABS
1.0000 | ORAL_TABLET | ORAL | Status: DC | PRN
  Administered 2017-09-18 (×2): 2 via ORAL
  Administered 2017-09-18 – 2017-09-19 (×2): 1 via ORAL
  Filled 2017-09-18: qty 2
  Filled 2017-09-18: qty 1
  Filled 2017-09-18: qty 2
  Filled 2017-09-18: qty 1

## 2017-09-18 MED ORDER — ROCURONIUM BROMIDE 50 MG/5ML IV SOLN
INTRAVENOUS | Status: AC
  Filled 2017-09-18: qty 1

## 2017-09-18 MED ORDER — SIMETHICONE 80 MG PO CHEW: 80.0000 mg | CHEWABLE_TABLET | Freq: Four times a day (QID) | ORAL | Status: DC | PRN

## 2017-09-18 MED ORDER — NALOXONE HCL 0.4 MG/ML IJ SOLN: 0.4000 mg | INTRAMUSCULAR | Status: DC | PRN

## 2017-09-18 MED ORDER — ACETAMINOPHEN 10 MG/ML IV SOLN
INTRAVENOUS | Status: DC | PRN
  Administered 2017-09-18: 1000 mg via INTRAVENOUS

## 2017-09-18 MED ORDER — ONDANSETRON HCL 4 MG/2ML IJ SOLN: 4.0000 mg | Freq: Four times a day (QID) | INTRAMUSCULAR | Status: DC | PRN

## 2017-09-18 MED ORDER — PHENYLEPHRINE HCL 10 MG/ML IJ SOLN
INTRAMUSCULAR | Status: DC | PRN
  Administered 2017-09-18: 50 ug via INTRAVENOUS
  Administered 2017-09-18: 150 ug via INTRAVENOUS
  Administered 2017-09-18: 100 ug via INTRAVENOUS

## 2017-09-18 MED ORDER — MIDAZOLAM HCL 2 MG/2ML IJ SOLN
INTRAMUSCULAR | Status: AC
  Filled 2017-09-18: qty 2

## 2017-09-18 MED ORDER — LACTATED RINGERS IV SOLN
INTRAVENOUS | Status: DC
  Administered 2017-09-18 – 2017-09-19 (×2): via INTRAVENOUS

## 2017-09-18 MED ORDER — FENTANYL CITRATE (PF) 250 MCG/5ML IJ SOLN
INTRAMUSCULAR | Status: AC
  Filled 2017-09-18: qty 5

## 2017-09-18 MED ORDER — DIPHENHYDRAMINE HCL 12.5 MG/5ML PO ELIX
12.5000 mg | ORAL_SOLUTION | Freq: Four times a day (QID) | ORAL | Status: DC | PRN
  Filled 2017-09-18: qty 5

## 2017-09-18 MED ORDER — DEXAMETHASONE SODIUM PHOSPHATE 10 MG/ML IJ SOLN
INTRAMUSCULAR | Status: DC | PRN
Start: 2017-09-18 — End: 2017-09-18
  Administered 2017-09-18: 10 mg via INTRAVENOUS

## 2017-09-18 MED ORDER — DIPHENHYDRAMINE HCL 50 MG/ML IJ SOLN: 12.5000 mg | Freq: Four times a day (QID) | INTRAMUSCULAR | Status: DC | PRN

## 2017-09-18 MED ORDER — KETOROLAC TROMETHAMINE 30 MG/ML IJ SOLN
INTRAMUSCULAR | Status: AC
  Filled 2017-09-18: qty 1

## 2017-09-18 MED ORDER — SUGAMMADEX SODIUM 200 MG/2ML IV SOLN
INTRAVENOUS | Status: AC
  Filled 2017-09-18: qty 2

## 2017-09-18 MED ORDER — CEFAZOLIN SODIUM-DEXTROSE 2-4 GM/100ML-% IV SOLN
2.0000 g | Freq: Once | INTRAVENOUS | Status: AC
  Administered 2017-09-18: 2 g via INTRAVENOUS

## 2017-09-18 MED ORDER — KETOROLAC TROMETHAMINE 30 MG/ML IJ SOLN
30.0000 mg | Freq: Three times a day (TID) | INTRAMUSCULAR | Status: DC
  Administered 2017-09-18 – 2017-09-19 (×2): 30 mg via INTRAVENOUS
  Filled 2017-09-18: qty 1

## 2017-09-18 MED ORDER — FAMOTIDINE 20 MG PO TABS
ORAL_TABLET | ORAL | Status: AC
  Filled 2017-09-18: qty 1

## 2017-09-18 MED ORDER — KETOROLAC TROMETHAMINE 30 MG/ML IJ SOLN
INTRAMUSCULAR | Status: DC | PRN
  Administered 2017-09-18: 30 mg via INTRAVENOUS

## 2017-09-18 MED ORDER — ONDANSETRON HCL 4 MG/2ML IJ SOLN
INTRAMUSCULAR | Status: DC | PRN
Start: 2017-09-18 — End: 2017-09-18
  Administered 2017-09-18: 4 mg via INTRAVENOUS

## 2017-09-18 MED ORDER — ONDANSETRON HCL 4 MG/2ML IJ SOLN: 4.0000 mg | Freq: Once | INTRAMUSCULAR | Status: DC | PRN

## 2017-09-18 MED ORDER — ROCURONIUM BROMIDE 100 MG/10ML IV SOLN
INTRAVENOUS | Status: DC | PRN
  Administered 2017-09-18: 50 mg via INTRAVENOUS
  Administered 2017-09-18: 10 mg via INTRAVENOUS

## 2017-09-18 MED ORDER — FENTANYL CITRATE (PF) 100 MCG/2ML IJ SOLN
INTRAMUSCULAR | Status: AC
  Administered 2017-09-18: 25 ug via INTRAVENOUS
  Filled 2017-09-18: qty 2

## 2017-09-18 MED ORDER — PROPOFOL 10 MG/ML IV BOLUS
INTRAVENOUS | Status: DC | PRN
  Administered 2017-09-18: 120 mg via INTRAVENOUS

## 2017-09-18 MED ORDER — ONDANSETRON HCL 4 MG PO TABS: 4.0000 mg | ORAL_TABLET | Freq: Four times a day (QID) | ORAL | Status: DC | PRN

## 2017-09-18 MED ORDER — CEFAZOLIN SODIUM-DEXTROSE 2-4 GM/100ML-% IV SOLN
INTRAVENOUS | Status: AC
  Filled 2017-09-18: qty 100

## 2017-09-18 MED ORDER — DEXAMETHASONE SODIUM PHOSPHATE 10 MG/ML IJ SOLN
INTRAMUSCULAR | Status: AC
  Filled 2017-09-18: qty 1

## 2017-09-18 MED ORDER — BUPIVACAINE HCL 0.5 % IJ SOLN
INTRAMUSCULAR | Status: DC | PRN
  Administered 2017-09-18: 13 mL

## 2017-09-18 MED ORDER — SODIUM CHLORIDE 0.9% FLUSH: 9.0000 mL | INTRAVENOUS | Status: DC | PRN

## 2017-09-18 MED ORDER — SUGAMMADEX SODIUM 200 MG/2ML IV SOLN
INTRAVENOUS | Status: DC | PRN
  Administered 2017-09-18: 150 mg via INTRAVENOUS

## 2017-09-18 MED ORDER — LIDOCAINE-EPINEPHRINE 1 %-1:100000 IJ SOLN
INTRAMUSCULAR | Status: AC
  Filled 2017-09-18: qty 1

## 2017-09-18 MED ORDER — FENTANYL CITRATE (PF) 100 MCG/2ML IJ SOLN
25.0000 ug | INTRAMUSCULAR | Status: DC | PRN
  Administered 2017-09-18 (×4): 25 ug via INTRAVENOUS

## 2017-09-18 MED ORDER — FAMOTIDINE 20 MG PO TABS
20.0000 mg | ORAL_TABLET | Freq: Once | ORAL | Status: AC
  Administered 2017-09-18: 20 mg via ORAL

## 2017-09-18 MED ORDER — FENTANYL CITRATE (PF) 100 MCG/2ML IJ SOLN
INTRAMUSCULAR | Status: DC | PRN
  Administered 2017-09-18: 50 ug via INTRAVENOUS
  Administered 2017-09-18: 100 ug via INTRAVENOUS
  Administered 2017-09-18: 50 ug via INTRAVENOUS
  Administered 2017-09-18: 100 ug via INTRAVENOUS

## 2017-09-18 MED ORDER — ONDANSETRON HCL 4 MG/2ML IJ SOLN
INTRAMUSCULAR | Status: AC
  Filled 2017-09-18: qty 2

## 2017-09-18 MED ORDER — MORPHINE SULFATE (PF) 2 MG/ML IV SOLN
1.0000 mg | INTRAVENOUS | Status: DC | PRN
  Administered 2017-09-18: 2 mg via INTRAVENOUS
  Filled 2017-09-18: qty 1

## 2017-09-18 MED ORDER — PROPOFOL 10 MG/ML IV BOLUS
INTRAVENOUS | Status: AC
  Filled 2017-09-18: qty 20

## 2017-09-18 MED ORDER — BUPIVACAINE HCL (PF) 0.5 % IJ SOLN
INTRAMUSCULAR | Status: AC
  Filled 2017-09-18: qty 30

## 2017-09-18 MED ORDER — MORPHINE SULFATE (PF) 2 MG/ML IV SOLN
1.0000 mg | INTRAVENOUS | Status: DC | PRN
  Administered 2017-09-18 (×2): 2 mg via INTRAVENOUS
  Filled 2017-09-18 (×2): qty 1

## 2017-09-18 MED ORDER — KETOROLAC TROMETHAMINE 30 MG/ML IJ SOLN
30.0000 mg | Freq: Three times a day (TID) | INTRAMUSCULAR | Status: DC | PRN
  Filled 2017-09-18 (×2): qty 1

## 2017-09-18 MED ORDER — KETOROLAC TROMETHAMINE 30 MG/ML IJ SOLN: 30.0000 mg | Freq: Three times a day (TID) | INTRAMUSCULAR | Status: DC | PRN

## 2017-09-18 MED ORDER — MIDAZOLAM HCL 2 MG/2ML IJ SOLN
INTRAMUSCULAR | Status: DC | PRN
  Administered 2017-09-18: 2 mg via INTRAVENOUS

## 2017-09-18 MED ORDER — LIDOCAINE HCL (PF) 2 % IJ SOLN
INTRAMUSCULAR | Status: AC
  Filled 2017-09-18: qty 10

## 2017-09-18 MED ORDER — FLEET ENEMA 7-19 GM/118ML RE ENEM: 1.0000 | ENEMA | Freq: Once | RECTAL | Status: DC

## 2017-09-18 MED ORDER — LACTATED RINGERS IV SOLN
INTRAVENOUS | Status: DC
  Administered 2017-09-18: 125 mL/h via INTRAVENOUS

## 2017-09-18 SURGICAL SUPPLY — 49 items
BAG URINE DRAINAGE (UROLOGICAL SUPPLIES) ×4 IMPLANT
BLADE SURG SZ11 CARB STEEL (BLADE) ×4 IMPLANT
CATH FOLEY 2WAY  5CC 16FR (CATHETERS) ×2
CATH URTH 16FR FL 2W BLN LF (CATHETERS) ×2 IMPLANT
CHLORAPREP W/TINT 26ML (MISCELLANEOUS) ×4 IMPLANT
CLOSURE WOUND 1/2 X4 (GAUZE/BANDAGES/DRESSINGS) ×1
DERMABOND ADVANCED (GAUZE/BANDAGES/DRESSINGS) ×2
DERMABOND ADVANCED .7 DNX12 (GAUZE/BANDAGES/DRESSINGS) ×2 IMPLANT
DRAPE SURG 17X11 SM STRL (DRAPES) ×4 IMPLANT
DRSG TEGADERM 2-3/8X2-3/4 SM (GAUZE/BANDAGES/DRESSINGS) ×16 IMPLANT
ELECT REM PT RETURN 9FT ADLT (ELECTROSURGICAL) ×4
ELECTRODE REM PT RTRN 9FT ADLT (ELECTROSURGICAL) ×2 IMPLANT
FILTER LAP SMOKE EVAC STRL (MISCELLANEOUS) ×4 IMPLANT
GLOVE BIO SURGEON STRL SZ8 (GLOVE) ×16 IMPLANT
GOWN STRL REUS W/ TWL LRG LVL3 (GOWN DISPOSABLE) ×6 IMPLANT
GOWN STRL REUS W/ TWL XL LVL3 (GOWN DISPOSABLE) ×6 IMPLANT
GOWN STRL REUS W/TWL LRG LVL3 (GOWN DISPOSABLE) ×6
GOWN STRL REUS W/TWL XL LVL3 (GOWN DISPOSABLE) ×6
GRASPER SUT TROCAR 14GX15 (MISCELLANEOUS) ×4 IMPLANT
HANDLE YANKAUER SUCT BULB TIP (MISCELLANEOUS) ×4 IMPLANT
IRRIGATION STRYKERFLOW (MISCELLANEOUS) ×2 IMPLANT
IRRIGATOR STRYKERFLOW (MISCELLANEOUS) ×4
KIT PINK PAD W/HEAD ARE REST (MISCELLANEOUS) ×4
KIT PINK PAD W/HEAD ARM REST (MISCELLANEOUS) ×2 IMPLANT
KIT TURNOVER CYSTO (KITS) ×4 IMPLANT
LABEL OR SOLS (LABEL) ×4 IMPLANT
NEEDLE HYPO 22GX1.5 SAFETY (NEEDLE) ×4 IMPLANT
PACK BASIN MINOR ARMC (MISCELLANEOUS) ×4 IMPLANT
PACK GYN LAPAROSCOPIC (MISCELLANEOUS) ×4 IMPLANT
PAD OB MATERNITY 4.3X12.25 (PERSONAL CARE ITEMS) ×4 IMPLANT
SCISSORS METZENBAUM CVD 33 (INSTRUMENTS) IMPLANT
SHEARS HARMONIC ACE PLUS 36CM (ENDOMECHANICALS) ×4 IMPLANT
SLEEVE ENDOPATH XCEL 5M (ENDOMECHANICALS) ×8 IMPLANT
SPONGE GAUZE 2X2 8PLY STER LF (GAUZE/BANDAGES/DRESSINGS) ×3
SPONGE GAUZE 2X2 8PLY STRL LF (GAUZE/BANDAGES/DRESSINGS) ×9 IMPLANT
SPONGE XRAY 4X4 16PLY STRL (MISCELLANEOUS) ×4 IMPLANT
STRIP CLOSURE SKIN 1/2X4 (GAUZE/BANDAGES/DRESSINGS) ×3 IMPLANT
SUT VIC AB 0 CT1 27 (SUTURE) ×4
SUT VIC AB 0 CT1 27XCR 8 STRN (SUTURE) ×4 IMPLANT
SUT VIC AB 0 CT1 36 (SUTURE) ×4 IMPLANT
SUT VIC AB 0 CT2 27 (SUTURE) IMPLANT
SUT VIC AB 2-0 UR6 27 (SUTURE) IMPLANT
SUT VIC AB 4-0 SH 27 (SUTURE) ×4
SUT VIC AB 4-0 SH 27XANBCTRL (SUTURE) ×4 IMPLANT
SYR 10ML LL (SYRINGE) ×4 IMPLANT
SYR CONTROL 10ML (SYRINGE) ×4 IMPLANT
TROCAR ENDO BLADELESS 11MM (ENDOMECHANICALS) IMPLANT
TROCAR XCEL NON-BLD 5MMX100MML (ENDOMECHANICALS) ×4 IMPLANT
TUBING INSUF HEATED (TUBING) ×4 IMPLANT

## 2017-09-18 NOTE — Anesthesia Post-op Follow-up Note (Signed)
Anesthesia QCDR form completed.        

## 2017-09-18 NOTE — OR Nursing (Signed)
Loyda here as interpreter during the admission process. Reviewed information with family that was discussed with patient.

## 2017-09-18 NOTE — Op Note (Signed)
NAME: Sherry Short, Sherry Short MEDICAL RECORD [id number] ACCOUNT [id number] DATE OF BIRTH:[birth year] FACILITY: [facility] LOCATION: [facility] PHYSICIAN:THOMAS Josefine Class, MD  OPERATIVE REPORT  DATE OF PROCEDURE:  09/18/2017  PREOPERATIVE DIAGNOSES: 1.  Chronic pelvic pain. 2.  Dyspareunia. 3.  Uterine fibroids.  POSTOPERATIVE DIAGNOSES: 1.  Chronic pelvic pain. 2.  Dyspareunia. 3.  Pelvic adhesive disease.  PROCEDURE: 1.  Laparoscopic assisted vaginal hysterectomy. 2.  Bilateral salpingectomy. 3.  Left ovarian cystectomies, 2.  SURGEON:  Boykin Nearing, MD  FIRST ASSISTANT:  Chelsea Ward.  SECOND ASSISTANT:  PA student, Verne Grain.  INDICATIONS:  A 42 year old gravida 5, para 4 patient with a long history of chronic pelvic pain, dyspareunia.  The patient was noted to have a 5 cm uterine fibroid on ultrasound.  FINDINGS:  Several adhesions, right and left pelvis, including the fallopian tubes and the bowel on the left side.  Globular uterus consistent with adenomyosis.  DESCRIPTION OF PROCEDURE:  After adequate general endotracheal anesthesia, the patient was placed in dorsal supine position.  Legs placed in the [place].  The patient was prepped and draped in normal sterile fashion.  The patient did receive 2  grams IV Ancef prior to commencement of the case.  A timeout was performed.  The bladder was drained with a straight catheter yielding 100 mL of urine.  Gloves were changed and a 5 mm infraumbilical incision was made after injecting with 0.5% lidocaine  with epinephrine.  A laparoscope was advanced into this incision under direct visualization with the Optiview cannula.  Upon entry into the abdominal cavity, the patient's abdomen was insufflated with carbon dioxide.  Second port site was placed.  Left  lower quadrant 3 cm medial to the left anterior iliac spine.  Under direct visualization, the trocar was advanced.  A third port site was placed in the  right lower quadrant, again 3 cm medial to the right anterior iliac spine.  Trocar was advanced under  direct visualization.  Initial impression revealed multiple adhesions of the fallopian tubes to the sidewall and adhesions from the left colon to the left adnexa.  There were 2 ovarian cysts noted on the left ovary.  The larger cyst was opened and mucoid  discharge was noted.  Each cyst wall was then removed with traction and countertraction.  Kleppingers were used to control hemostasis.  Each cyst wall will be sent separately to pathology.  The left fallopian tube was grasped at the fimbriated end and  with the Harmonic scalpel, the mesosalpinx was dissected and the fallopian tube was left attached to the cornual portion of the uterus.  The round ligament was then opened as well as the broad ligament on the left and the left uterine artery was  identified, cauterized and transected with the Harmonic scalpel.  Bladder flap was created with the Harmonic scalpel.  Similar procedure was repeated on the patient's right side.  Again, the fimbriated end was grasped at the distal portion of the  fallopian tube and the mesosalpinx was dissected.  Round ligament was opened and the broad ligament was opened.  Right uterine artery was then cauterized and transected.  The rest of the bladder flap was created.  Upper abdomen appeared normal.   Attention was then directed vaginally and a weighted speculum was placed in the posterior vaginal vault and the cervix was grasped with 2 thyroid tenacula and the cervix was injected with 0.5% lidocaine with 1:100,000 epinephrine.  A direct posterior  colpotomy incision was made.  Upon entry into the posterior cul-de-sac, the uterosacral ligaments were bilaterally clamped, transected, suture ligated with 0 Vicryl suture.  Cardinal ligaments were then clamped, transected, suture ligated with 0 Vicryl  suture and the anterior cervix was incised with the Bovie and a direct anterior  incision was made into the anterior cul-de-sac and the bladder was reflected anteriorly.  Two additional clamps were placed on each broad ligament and the cervix was then  cored out to allow for the uterus to be delivered through the incision.  Uterus was delivered without difficulty.  Good hemostasis was noted.  The vaginal cuff was then closed with a running 0 Vicryl suture.  Gloves were changed and attention was then  directed again through the laparoscope and good hemostasis was noted.  The patient's abdomen was copiously irrigated.  Pictures were taken and pressure was lowered to 7 mmHg and good hemostasis was noted.  The patient's abdomen was deflated and all 3  incisions were closed with interrupted 4-0 Vicryl suture.  Sterile dressings were applied.  A Foley catheter was placed at the end of the case.    The patient tolerated the procedure well.    ESTIMATED BLOOD LOSS:  175 mL.    INTRAOPERATIVE FLUIDS:  750 mL   URINE OUTPUT:  175 mL.    DISPOSITION:   The patient was taken to recovery room in good condition.  AN/NUANCE  D:09/18/2017 T:09/18/2017 JOB:[id number]

## 2017-09-18 NOTE — Transfer of Care (Signed)
Immediate Anesthesia Transfer of Care Note  Patient: Sherry Short  Procedure(s) Performed: LAPAROSCOPIC ASSISTED VAGINAL HYSTERECTOMY WITH SALPINGECTOMY (Bilateral Abdomen) LAPAROSCOPIC OVARIAN CYSTECTOMY (x 2) (Left )  Patient Location: PACU  Anesthesia Type:General  Level of Consciousness: drowsy  Airway & Oxygen Therapy: Patient Spontanous Breathing and Patient connected to face mask oxygen  Post-op Assessment: Report given to RN and Post -op [place] signs reviewed and stable  Post [place] signs: Reviewed and stable  Last Vitals:  Vitals Value Taken Time  BP 115/67 09/18/2017 10:28 AM  Temp    Pulse 88 09/18/2017 10:28 AM  Resp 13 09/18/2017 10:28 AM  SpO2 99 % 09/18/2017 10:28 AM  Vitals shown include unvalidated device data.  Last Pain:  Vitals:   09/18/17 0635  TempSrc: Oral  PainSc: 7       Patients Stated Pain Goal: 0 (78/93/81 0175)  Complications: No apparent anesthesia complications

## 2017-09-18 NOTE — Progress Notes (Signed)
Patient ID: Sherry Short, female   DOB: 13-Jul-1975, 41 y.o.   MRN: [id number] DOS  LAVH , LOA and bilateral salpingectomy Pt pain has been hard to control with percocet and IV morphine . VSS Urine output low normal . Scant blood on pad Abd: soft non distended  A: poor pain control  P: Morphine PCA Increase IVF to 125 hr

## 2017-09-18 NOTE — Anesthesia Procedure Notes (Signed)
Procedure Name: Intubation Date/Time: 09/18/2017 7:45 AM Performed by: Eben Burow, CRNA Pre-anesthesia Checklist: Patient identified, Emergency Drugs available, Suction available, Patient being monitored and Timeout performed Patient Re-evaluated:Patient Re-evaluated prior to induction Oxygen Delivery Method: Circle system utilized Preoxygenation: Pre-oxygenation with 100% oxygen Induction Type: IV induction Ventilation: Mask ventilation without difficulty Laryngoscope Size: Mac and 3 Grade View: Grade I Tube type: Oral Tube size: 7.0 mm Number of attempts: 1 Airway Equipment and Method: Stylet and LTA kit utilized Placement Confirmation: ETT inserted through vocal cords under direct vision,  positive ETCO2 and breath sounds checked- equal and bilateral Secured at: 21 cm Tube secured with: Tape Dental Injury: Teeth and Oropharynx as per pre-operative assessment

## 2017-09-18 NOTE — Anesthesia Preprocedure Evaluation (Signed)
Anesthesia Evaluation  Patient identified by MRN, date of birth, ID band Patient awake    Reviewed: Allergy & Precautions, H&P , NPO status , Patient's Chart, lab work & pertinent test results, reviewed documented beta blocker date and time   Airway Mallampati: II  TM Distance: >3 FB Neck ROM: full    Dental  (+) Teeth Intact   Pulmonary neg pulmonary ROS,    Pulmonary exam normal        Cardiovascular + angina with exertion negative cardio ROS Normal cardiovascular exam Rhythm:regular Rate:Normal     Neuro/Psych  Headaches, Anxiety negative neurological ROS  negative psych ROS   GI/Hepatic negative GI ROS, Neg liver ROS,   Endo/Other  negative endocrine ROS  Renal/GU negative Renal ROS  negative genitourinary   Musculoskeletal   Abdominal   Peds  Hematology negative hematology ROS (+)   Anesthesia Other Findings Past Medical History: /: Anginal pain ([place])     Comment:  chest pain probably related to anxiety, per pt. No date: Anxiety 12/13/2016: Cancer ([place])     Comment:  Left Breast (DCIS) 08/2017: Migraine headache     Comment:  tylenol and pain medicine helps this No date: Personal history of radiation therapy No date: Seasonal allergies Past Surgical History: 11/30/2016: BREAST BIOPSY; Left     Comment:  left breast calcs + 12/13/2016: BREAST BIOPSY; Left     Comment:  Left Affrim Bx-+ 12/13/2016: BREAST BIOPSY; Left     Comment:  Left Affirm Bx- Path pending 06/01/2017: BREAST BIOPSY; Left     Comment:  Left Affirm- Path pending 01/02/2017: BREAST LUMPECTOMY; Left     Comment:  Needle localization and lymph node disection 01/02/2017: BREAST LUMPECTOMY WITH SENTINEL LYMPH NODE BIOPSY; Left     Comment:  Procedure: BREAST LUMPECTOMY WITH SENTINEL LYMPH NODE               BX;  Surgeon: Clayburn Pert, MD;  Location: [facility] ORS;               Service: General;  Laterality: Left; No date: TUBAL  LIGATION BMI    Body Mass Index:  25.32 kg/m     Reproductive/Obstetrics negative OB ROS                             Anesthesia Physical Anesthesia Plan  ASA: II  Anesthesia Plan: General ETT   Post-op Pain Management:    Induction:   PONV Risk Score and Plan:   Airway Management Planned:   Additional Equipment:   Intra-op Plan:   Post-operative Plan:   Informed Consent: I have reviewed the patients History and Physical, chart, labs and discussed the procedure including the risks, benefits and alternatives for the proposed anesthesia with the patient or authorized representative who has indicated his/her understanding and acceptance.   Dental Advisory Given  Plan Discussed with: CRNA  Anesthesia Plan Comments:         Anesthesia Quick Evaluation

## 2017-09-18 NOTE — Progress Notes (Signed)
Pt is ready for surgery . NPO . All questions answered

## 2017-09-18 NOTE — Brief Op Note (Addendum)
09/18/2017  10:14 AM  PATIENT:  Sherry Short  42 y.o. female  PRE-OPERATIVE DIAGNOSIS:  chronic pelvic pain, fibroid  POST-OPERATIVE DIAGNOSIS:  chronic pelvic pain, fibroid Pelvic adhesions , left ovarian cyst x 2 PROCEDURE:  Procedure(s): LAPAROSCOPIC ASSISTED VAGINAL HYSTERECTOMY WITH SALPINGECTOMY (Bilateral) LAPAROSCOPIC OVARIAN CYSTECTOMY (x 2) (Left)  Lysis of adhesions   SURGEON:  Surgeon(s) and Role:    * Schermerhorn, Gwen Her, MD - Primary    * Ward, Honor Loh, MD - Assisting  PHYSICIAN ASSISTANT: PA student Verne Grain ASSISTANTS: none   ANESTHESIA:   general  EBL:  175 mL   BLOOD ADMINISTERED:none  DRAINS: Urinary Catheter (Foley)   LOCAL MEDICATIONS USED:  LIDOCAINE   SPECIMEN:  Source of Specimen:  cervix , uterus , bilateral tubes , left ovarian cyst wall x 2  DISPOSITION OF SPECIMEN:  PATHOLOGY  COUNTS:  YES  TOURNIQUET:  * No tourniquets in log *  DICTATION: .Other Dictation: Dictation Number verbal  PLAN OF CARE: Admit for overnight observation  PATIENT DISPOSITION:  PACU - hemodynamically stable.   Delay start of Pharmacological VTE agent (>24hrs) due to surgical blood loss or risk of bleeding: not applicable

## 2017-09-19 ENCOUNTER — Encounter: Payer: Self-pay | Admitting: Obstetrics and Gynecology

## 2017-09-19 DIAGNOSIS — G8929 Other chronic pain: Secondary | ICD-10-CM | POA: Diagnosis not present

## 2017-09-19 LAB — BASIC METABOLIC PANEL
Anion gap: 7 (ref 5–15)
BUN: 10 mg/dL (ref 6–20)
CHLORIDE: 110 mmol/L (ref 101–111)
CO2: 20 mmol/L — ABNORMAL LOW (ref 22–32)
CREATININE: 0.58 mg/dL (ref 0.44–1.00)
Calcium: 8.4 mg/dL — ABNORMAL LOW (ref 8.9–10.3)
Glucose, Bld: 124 mg/dL — ABNORMAL HIGH (ref 65–99)
POTASSIUM: 4.1 mmol/L (ref 3.5–5.1)
SODIUM: 137 mmol/L (ref 135–145)

## 2017-09-19 LAB — CBC
HCT: 29.5 % — ABNORMAL LOW (ref 35.0–47.0)
HEMOGLOBIN: 10.1 g/dL — AB (ref 12.0–16.0)
MCH: 30.3 pg (ref 26.0–34.0)
MCHC: 34.4 g/dL (ref 32.0–36.0)
MCV: 88 fL (ref 80.0–100.0)
PLATELETS: 210 10*3/uL (ref 150–440)
RBC: 3.35 MIL/uL — AB (ref 3.80–5.20)
RDW: 12.9 % (ref 11.5–14.5)
WBC: 11.6 10*3/uL — ABNORMAL HIGH (ref 3.6–11.0)

## 2017-09-19 MED ORDER — ONDANSETRON HCL 4 MG PO TABS: 4.0000 mg | ORAL_TABLET | Freq: Four times a day (QID) | ORAL | 0 refills | Status: DC | PRN

## 2017-09-19 MED ORDER — OXYCODONE-ACETAMINOPHEN 5-325 MG PO TABS: 1.0000 | ORAL_TABLET | ORAL | 0 refills | Status: DC | PRN

## 2017-09-19 MED ORDER — IBUPROFEN 800 MG PO TABS: 800.0000 mg | ORAL_TABLET | Freq: Three times a day (TID) | ORAL | 0 refills | Status: DC | PRN

## 2017-09-19 MED ORDER — DOCUSATE SODIUM 100 MG PO CAPS: 100.0000 mg | ORAL_CAPSULE | Freq: Every day | ORAL | 2 refills | Status: DC | PRN

## 2017-09-19 MED ORDER — SIMETHICONE 80 MG PO CHEW: 80.0000 mg | CHEWABLE_TABLET | Freq: Four times a day (QID) | ORAL | 0 refills | Status: DC | PRN

## 2017-09-19 NOTE — Progress Notes (Signed)
Patient discharged home. Discharge instructions (English/Spanish) and prescriptions given and reviewed with patient. Patient refused translator. Patient verbalized understanding. Escorted out by auxillary.

## 2017-09-19 NOTE — Discharge Summary (Signed)
Physician Discharge Summary  Patient ID: Sherry Short MRN: [id number] DOB/AGE: 06/23/1975 42 y.o.  Admit date: 09/18/2017 Discharge date: 09/19/2017  Admission Diagnoses:CPP , dyspareunia  Discharge Diagnoses:  Active Problems:   Postoperative state   Discharged Condition: good  Hospital Course: pt underwent an uncomplicated LAVH and bilateral salpingectomy and LOA  And left ovarian cystectomies x2. Poor pain control post requiring PCA  POD#1 better   Consults: None  Significant Diagnostic Studies: labs:  Results for orders placed or performed during the hospital encounter of 09/18/17 (from the past 24 hour(s))  CBC     Status: Abnormal   Collection Time: 09/19/17  5:16 AM  Result Value Ref Range   WBC 11.6 (H) 3.6 - 11.0 K/uL   RBC 3.35 (L) 3.80 - 5.20 MIL/uL   Hemoglobin 10.1 (L) 12.0 - 16.0 g/dL   HCT 29.5 (L) 35.0 - 47.0 %   MCV 88.0 80.0 - 100.0 fL   MCH 30.3 26.0 - 34.0 pg   MCHC 34.4 32.0 - 36.0 g/dL   RDW 12.9 11.5 - 14.5 %   Platelets 210 150 - 440 K/uL  Basic metabolic panel     Status: Abnormal   Collection Time: 09/19/17  5:16 AM  Result Value Ref Range   Sodium 137 135 - 145 mmol/L   Potassium 4.1 3.5 - 5.1 mmol/L   Chloride 110 101 - 111 mmol/L   CO2 20 (L) 22 - 32 mmol/L   Glucose, Bld 124 (H) 65 - 99 mg/dL   BUN 10 6 - 20 mg/dL   Creatinine, Ser 0.58 0.44 - 1.00 mg/dL   Calcium 8.4 (L) 8.9 - 10.3 mg/dL   GFR calc non Af Amer >60 >60 mL/min   GFR calc Af Amer >60 >60 mL/min   Anion gap 7 5 - 15    Treatments: surgery: as above  Discharge Exam: Blood pressure 111/70, pulse 63, temperature 97.9 F (36.6 C), temperature source Oral, resp. rate 18, height 5\' 1"  (1.549 m), weight 60.8 kg (134 lb), last menstrual period 09/04/2017, SpO2 99 %. General appearance: alert and cooperative Resp: clear to auscultation bilaterally Cardio: regular rate and rhythm, S1, S2 normal, no murmur, click, rub or gallop GI: soft, non-tender; bowel sounds normal; no  masses,  no organomegaly  Disposition: Discharge disposition: 01-Home or Self Care       Discharge Instructions    Call MD for:  difficulty breathing, headache or visual disturbances   Complete by:  As directed    Call MD for:  extreme fatigue   Complete by:  As directed    Call MD for:  hives   Complete by:  As directed    Call MD for:  persistant dizziness or light-headedness   Complete by:  As directed    Call MD for:  persistant nausea and vomiting   Complete by:  As directed    Call MD for:  redness, tenderness, or signs of infection (pain, swelling, redness, odor or green/yellow discharge around incision site)   Complete by:  As directed    Call MD for:  severe uncontrolled pain   Complete by:  As directed    Call MD for:  temperature >100.4   Complete by:  As directed    Diet - low sodium heart healthy   Complete by:  As directed    Discharge instructions   Complete by:  As directed    Pelvic rest 4 weeks , no heavy lifting > #15  Increase activity slowly   Complete by:  As directed      Allergies as of 09/19/2017   No Known Allergies     Medication List    STOP taking these medications   acetaminophen 325 MG tablet Commonly known as:  TYLENOL   gabapentin 300 MG capsule Commonly known as:  NEURONTIN   lidocaine-prilocaine cream Commonly known as:  EMLA     TAKE these medications   docusate sodium 100 MG capsule Commonly known as:  COLACE Take 1 capsule (100 mg total) by mouth daily as needed.   ibuprofen 800 MG tablet Commonly known as:  ADVIL,MOTRIN Take 1 tablet (800 mg total) by mouth every 8 (eight) hours as needed.   ondansetron 4 MG tablet Commonly known as:  ZOFRAN Take 1 tablet (4 mg total) by mouth every 6 (six) hours as needed for nausea.   oxyCODONE-acetaminophen 5-325 MG tablet Commonly known as:  PERCOCET/ROXICET Take 1-2 tablets by mouth every 4 (four) hours as needed for moderate pain ((when tolerating fluids)).   simethicone  80 MG chewable tablet Commonly known as:  MYLICON Chew 1 tablet (80 mg total) by mouth 4 (four) times daily as needed for flatulence.   tamoxifen 20 MG tablet Commonly known as:  NOLVADEX Take 1 tablet (20 mg total) by mouth daily.   vitamin E 400 UNIT capsule Take 400 Units by mouth daily.      Follow-up Information    Abram Sax, Gwen Her, MD Follow up in 2 week(s).   Specialty:  Obstetrics and Gynecology Why:  post op Contact information: [address] [place] [address] [phone]           Signed: Gwen Her Marylou Wages 09/19/2017, 9:05 AM

## 2017-09-19 NOTE — Plan of Care (Signed)
Vs stable; moves well in the bed; all 3 incisions covered with gauze and tegaderm and no drainage on any dressing; pain still 10/10 with pain med options at end of previous shift; MD ordered morphine PCA; PCA started at beginning of current shift; pt says it is "helping"; pt also can have PO percocet and IV toradol for pain control; urine output WNL; pt tolerating clear liquids well

## 2017-09-19 NOTE — Progress Notes (Signed)
Morphine PCA stopped/discontinued at 9am per Dr. Ouida Sills.  12 mL wasted with Jeanette Caprice RN. Yellow administration record form returned to pharmacy.

## 2017-09-21 LAB — SURGICAL PATHOLOGY

## 2017-09-24 NOTE — Anesthesia Postprocedure Evaluation (Signed)
Anesthesia Post Note  Patient: Sherry Short  Procedure(s) Performed: LAPAROSCOPIC ASSISTED VAGINAL HYSTERECTOMY WITH SALPINGECTOMY (Bilateral Abdomen) LAPAROSCOPIC OVARIAN CYSTECTOMY (x 2) (Left )  Patient location during evaluation: PACU Anesthesia Type: General Level of consciousness: awake and alert Pain management: pain level controlled Seufert Signs Assessment: post-procedure Wrenn signs reviewed and stable Respiratory status: spontaneous breathing, nonlabored ventilation, respiratory function stable and patient connected to nasal cannula oxygen Cardiovascular status: blood pressure returned to baseline and stable Postop Assessment: no apparent nausea or vomiting Anesthetic complications: no     Last Vitals:  Vitals:   09/19/17 0803 09/19/17 0804  BP:  111/70  Pulse:  63  Resp: 13 18  Temp:  36.6 C  SpO2: 99% 99%    Last Pain:  Vitals:   09/19/17 0804  TempSrc: Oral  PainSc: 6                  Molli Barrows

## 2017-09-27 ENCOUNTER — Ambulatory Visit
Admission: RE | Admit: 2017-09-27 | Discharge: 2017-09-27 | Disposition: A | Discharge status: Home / Self Care | Payer: Managed Care, Other (non HMO) | Source: Ambulatory Visit | Attending: Radiation Oncology | Admitting: Radiation Oncology

## 2017-09-27 ENCOUNTER — Other Ambulatory Visit: Payer: Self-pay

## 2017-09-27 ENCOUNTER — Encounter: Payer: Self-pay | Admitting: Radiation Oncology

## 2017-09-27 VITALS — BP 106/71 | HR 75 | Temp 97.3°F | Resp 18 | Wt 132.2 lb

## 2017-09-27 DIAGNOSIS — Z170 Estrogen receptor positive status [ER+]: Secondary | ICD-10-CM | POA: Insufficient documentation

## 2017-09-27 DIAGNOSIS — Z923 Personal history of irradiation: Secondary | ICD-10-CM | POA: Insufficient documentation

## 2017-09-27 DIAGNOSIS — D0512 Intraductal carcinoma in situ of left breast: Secondary | ICD-10-CM | POA: Diagnosis present

## 2017-09-27 NOTE — Progress Notes (Signed)
Radiation Oncology Follow up Note  Name: Sherry Short   Date:   09/27/2017 MRN:  [id number] DOB: [birth year]    This 42 y.o. 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  REFERRING PROVIDER: No ref. 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 She continues to have pain in her left axilla even know this area was not radiated.I've always believe this is scar tissue from her sentinel node biopsy and patient has a low pain threshold. She otherwise is without complaint as far as bone pain cough.patient did in fact have a biopsy back in February which showed benign tissue.biopsy was recommended by amammogram showing calcifications in the lower inner quadrant.  COMPLICATIONS OF TREATMENT: none  FOLLOW UP COMPLIANCE: keeps appointments   PHYSICAL EXAM:  BP 106/71   Pulse 75   Temp (!) 97.3 F (36.3 C)   Resp 18   Wt 132 lb 2.7 oz (59.9 kg)   LMP 09/04/2017 (Exact Date)   BMI 24.97 kg/m  Lungs are clear to A&P cardiac examination essentially unremarkable with regular rate and rhythm. No dominant mass or nodularity is noted in either breast in 2 positions examined. Incision is well-healed. No axillary or supraclavicular adenopathy is appreciated. Cosmetic result is excellent.there is still tenderness on palpation of her left axilla with no discernible mass or nodularity. Well-developed well-nourished patient in NAD. HEENT reveals PERLA, EOMI, discs not visualized.  Oral cavity is clear. No oral mucosal lesions are identified. Neck is clear without evidence of cervical or supraclavicular adenopathy. Lungs are clear to A&P. Cardiac examination is essentially unremarkable with regular rate and rhythm without murmur rub or thrill. Abdomen is benign with no  organomegaly or masses noted. Motor sensory and DTR levels are equal and symmetric in the upper and lower extremities. Cranial nerves II through XII are grossly intact. Proprioception is intact. No peripheral adenopathy or edema is identified. No motor or sensory levels are noted. Crude visual fields are within normal range.  RADIOLOGY RESULTS: mammograms and biopsy reports reviewed  PLAN: present time patient continues to have pain from her prior surgery. Axilla was not radiated. I've expressed that she needs to continue exercise and this area will continue eventually healed. I've also referred her back to her surgeon for any follow-up care. I have asked to see her out in 1 year for follow-up. She continues ontamoxifen without side effect. Patient knows to call with any concerns.  I would like to take this opportunity to thank you for allowing me to participate in the care of your patient.Noreene Filbert, MD

## 2017-10-09 ENCOUNTER — Other Ambulatory Visit: Payer: Self-pay

## 2017-10-09 DIAGNOSIS — D0512 Intraductal carcinoma in situ of left breast: Secondary | ICD-10-CM

## 2017-11-14 ENCOUNTER — Telehealth: Payer: Self-pay | Admitting: Surgery

## 2017-11-14 NOTE — Telephone Encounter (Signed)
Called patient back and asked her how she was feeling. She stated that for the past three days she started to have pain again under her left axilla and chest pain by the left ribcage. I asked patient if she had any injuries and she stated that she had not. Patient believes to have something on her left breast again causing the pain. Patient is scheduled to have an U/S done on 11/28/2017. I had told her that I called [facility] to reschedule her U/S to be done sooner rather than later but they were closed. I told her that I wanted her to be seen by a surgeon. Patient requested to be seen by Dr. Hampton Abbot since he speaks Spanish. However, I told patient that he was out on medical leave but that I had Dr. Dahlia Byes here tomorrow which speaks Spanish. She was glad to hear and agreed to come in. Patient was scheduled to be seen tomorrow 11/15/2017 at 10:00 AM.

## 2017-11-14 NOTE — Telephone Encounter (Signed)
Patient is calling and is complaining of pain under her breast as well as in the back, also feels a lump around the rib cage. Please call patient and advise.

## 2017-11-14 NOTE — Telephone Encounter (Signed)
Called patient back and had to leave her a voicemail letting her know that I scheduled an appointment to be seen by Dr. Rosana Hoes on Thursday 11/16/2017 at 11:00 AM. Patient needs an exam to be done.

## 2017-11-15 ENCOUNTER — Encounter: Payer: Self-pay | Admitting: Surgery

## 2017-11-15 ENCOUNTER — Ambulatory Visit (INDEPENDENT_AMBULATORY_CARE_PROVIDER_SITE_OTHER): Payer: Managed Care, Other (non HMO) | Admitting: Surgery

## 2017-11-15 VITALS — BP 110/73 | HR 74 | Temp 98.2°F | Wt 134.0 lb

## 2017-11-15 DIAGNOSIS — M79622 Pain in left upper arm: Secondary | ICD-10-CM | POA: Diagnosis not present

## 2017-11-15 MED ORDER — GABAPENTIN 600 MG PO TABS: 600.0000 mg | ORAL_TABLET | Freq: Three times a day (TID) | ORAL | 0 refills | Status: DC

## 2017-11-15 MED ORDER — TRIAMCINOLONE ACETONIDE 40 MG/ML IJ SUSP
40.0000 mg | Freq: Once | INTRAMUSCULAR | Status: AC
  Administered 2017-11-15: 40 mg via INTRAMUSCULAR

## 2017-11-15 NOTE — Patient Instructions (Signed)
Su receta fue enviado a su farmacia. Por favor empieze a tomarselo desde hoy. Recuerde de tomar una tableta cade 8 horas. Este le va a ayudar con Conservation officer, historic buildings y [place].  Si tiene preguntas, por favor llamenos.

## 2017-11-16 ENCOUNTER — Ambulatory Visit: Payer: Self-pay | Admitting: Surgery

## 2017-11-17 ENCOUNTER — Encounter: Payer: Self-pay | Admitting: Surgery

## 2017-11-17 NOTE — Progress Notes (Signed)
Outpatient Surgical Follow Up  11/17/2017  Sherry Short is an 42 y.o. female.   Chief Complaint  Patient presents with  . Follow-up    New lump found under Left breast, tender at touch    HPI: Sherry Short is a 42 year old female status post left lumpectomy and radiation therapy for DCIS 12/2016 by Dr Adonis Huguenin.  She did very well postoperatively and receive radiation therapy.  She states that a few weeks after radiation therapy started having left-sided chest wall pain.  The pain is intermittent worsening with certain movements and has been chronic.  Over the last week or so it has intensified and the pain is sharp moderate in intensity.  No specific alleviating factors.  Did have ultrasound and mammogram on February 2019 that I have personally review showing no evidence of recurrent disease.  She does have an upcoming mammogram and ultrasound within the next 10 days  Past Medical History:  Diagnosis Date  . Anginal pain ([place]) /   chest pain probably related to anxiety, per pt.  . Anxiety   . Cancer ([place]) 12/13/2016   Left Breast (DCIS)  . Migraine headache 08/2017   tylenol and pain medicine helps this  . Personal history of radiation therapy   . Seasonal allergies     Past Surgical History:  Procedure Laterality Date  . BREAST BIOPSY Left 11/30/2016   left breast calcs +  . BREAST BIOPSY Left 12/13/2016   Left Affrim Bx-+  . BREAST BIOPSY Left 12/13/2016   Left Affirm Bx- Path pending  . BREAST BIOPSY Left 06/01/2017   Left Affirm- Path pending  . BREAST LUMPECTOMY Left 01/02/2017   Needle localization and lymph node disection  . BREAST LUMPECTOMY WITH SENTINEL LYMPH NODE BIOPSY Left 01/02/2017   Procedure: BREAST LUMPECTOMY WITH SENTINEL LYMPH NODE BX;  Surgeon: Clayburn Pert, MD;  Location: [facility] ORS;  Service: General;  Laterality: Left;  . LAPAROSCOPIC OVARIAN CYSTECTOMY Left 09/18/2017   Procedure: LAPAROSCOPIC OVARIAN CYSTECTOMY (x 2);  Surgeon: Schermerhorn, Gwen Her,  MD;  Location: [facility] ORS;  Service: Gynecology;  Laterality: Left;  . LAPAROSCOPIC VAGINAL HYSTERECTOMY WITH SALPINGECTOMY Bilateral 09/18/2017   Procedure: LAPAROSCOPIC ASSISTED VAGINAL HYSTERECTOMY WITH SALPINGECTOMY;  Surgeon: Schermerhorn, Gwen Her, MD;  Location: [facility] ORS;  Service: Gynecology;  Laterality: Bilateral;  . TUBAL LIGATION      Family History  Problem Relation Age of Onset  . Breast cancer Sister 67  . Healthy Mother   . Diabetes Father     Social History:  reports that she has never smoked. She has never used smokeless tobacco. She reports that she does not drink alcohol or use drugs.  Allergies: No Known Allergies  Medications reviewed.    ROS Full ROS performed and is otherwise negative other than what is stated in HPI   BP 110/73   Pulse 74   Temp 98.2 F (36.8 C) (Oral)   Wt 134 lb (60.8 kg)   BMI 25.32 kg/m   Physical Exam  NAD, awake and alert Breast: 10 o'clock small nodule periareolar area. No discharge, no nipple retraction. Axilla w/o disease. LEft chest wall tenderness Abd: soft, NT  Assessment/Plan: S/p LEft Lumpectomy and radiation. No evidence of recurrence Cancer. Chronic pain related to radiation therapy. D/W her in detail and offered her IC nerve block and Gabapentin. She wishes to proceed 1. Left axillary pain  - triamcinolone acetonide (KENALOG-40) injection 40 mg  PROCEDURE NOTE DX: Chronic chest wall pain from radiation  Procedure: Intercostal Nerve  Block 5,6,7th bundles on the Left. Lidocaine 1%  w Epi + Marcaine .25% and 40 mg kenalog.  Findings: Significant relief of chest wall pain  Using sterile technique the rib cage was identified and three IC nerve block were performed in the standard fashion by infiltrating the periosteum of the rib. No complicaitons     Greater than 50% of the 15 minutes  visit was spent in counseling/coordination of care   Caroleen Hamman, MD [facility] Surgeon

## 2017-11-28 ENCOUNTER — Ambulatory Visit
Admission: RE | Admit: 2017-11-28 | Discharge: 2017-11-28 | Disposition: A | Discharge status: Home / Self Care | Payer: Managed Care, Other (non HMO) | Source: Ambulatory Visit | Attending: Surgery | Admitting: Surgery

## 2017-11-28 DIAGNOSIS — D0512 Intraductal carcinoma in situ of left breast: Secondary | ICD-10-CM | POA: Diagnosis present

## 2017-12-07 ENCOUNTER — Other Ambulatory Visit: Payer: Self-pay | Admitting: *Deleted

## 2017-12-07 MED ORDER — TAMOXIFEN CITRATE 20 MG PO TABS: 20.0000 mg | ORAL_TABLET | Freq: Every day | ORAL | 3 refills | Status: DC

## 2017-12-11 NOTE — Progress Notes (Signed)
[place]  [phone] Fax:(336) [phone]  ID: Sherry Short OB: 23-Mar-1976  MR#: [id number]  MQK#:[id number]  Patient Care Team: Katheren Shams as PCP - General  CHIEF COMPLAINT: Left breast DCIS.  INTERVAL HISTORY: Patient returns to clinic today for routine six-month follow-up.  She complains of persistent breast pain that started when she initiated tamoxifen, but otherwise feels well. She has no neurologic complaints. She denies any recent fevers or illnesses. She has a good appetite and denies weight loss. She denies any other pain. She has no chest pain or shortness of breath. She denies any nausea, vomiting, constipation, or diarrhea. She has no urinary complaints.  Patient offers no further specific complaints today.  REVIEW OF SYSTEMS:   Review of Systems  Constitutional: Negative.  Negative for fever, malaise/fatigue and weight loss.  Respiratory: Negative.  Negative for cough and shortness of breath.   Cardiovascular: Negative.  Negative for chest pain and leg swelling.  Gastrointestinal: Negative.  Negative for abdominal pain.  Genitourinary: Negative.  Negative for dysuria.  Musculoskeletal: Negative.   Skin: Negative.  Negative for rash.  Neurological: Negative.  Negative for sensory change and weakness.  Psychiatric/Behavioral: Negative.  The patient is not nervous/anxious.     As per HPI. Otherwise, a complete review of systems is negative.  PAST MEDICAL HISTORY: Past Medical History:  Diagnosis Date  . Anginal pain ([place]) /   chest pain probably related to anxiety, per pt.  . Anxiety   . Cancer ([place]) 12/13/2016   Left Breast (DCIS)  . Migraine headache 08/2017   tylenol and pain medicine helps this  . Personal history of radiation therapy   . Seasonal allergies     PAST SURGICAL HISTORY: Past Surgical History:  Procedure Laterality Date  . BREAST BIOPSY Left 11/30/2016   left breast calcs +  . BREAST BIOPSY Left  12/13/2016   Left Affrim Bx-+  . BREAST BIOPSY Left 12/13/2016   Left Affirm Bx- Path pending  . BREAST BIOPSY Left 06/01/2017   Left Affirm- Path pending  . BREAST LUMPECTOMY Left 01/02/2017   Needle localization and lymph node disection  . BREAST LUMPECTOMY WITH SENTINEL LYMPH NODE BIOPSY Left 01/02/2017   Procedure: BREAST LUMPECTOMY WITH SENTINEL LYMPH NODE BX;  Surgeon: Clayburn Pert, MD;  Location: [facility] ORS;  Service: General;  Laterality: Left;  . LAPAROSCOPIC OVARIAN CYSTECTOMY Left 09/18/2017   Procedure: LAPAROSCOPIC OVARIAN CYSTECTOMY (x 2);  Surgeon: Schermerhorn, Gwen Her, MD;  Location: [facility] ORS;  Service: Gynecology;  Laterality: Left;  . LAPAROSCOPIC VAGINAL HYSTERECTOMY WITH SALPINGECTOMY Bilateral 09/18/2017   Procedure: LAPAROSCOPIC ASSISTED VAGINAL HYSTERECTOMY WITH SALPINGECTOMY;  Surgeon: Schermerhorn, Gwen Her, MD;  Location: [facility] ORS;  Service: Gynecology;  Laterality: Bilateral;  . TUBAL LIGATION      FAMILY HISTORY: Family History  Problem Relation Age of Onset  . Breast cancer Sister 62  . Healthy Mother   . Diabetes Father     ADVANCED DIRECTIVES (Y/N):  N  HEALTH MAINTENANCE: Social History   Tobacco Use  . Smoking status: Never Smoker  . Smokeless tobacco: Never Used  Substance Use Topics  . Alcohol use: No  . Drug use: No     Colonoscopy:  PAP:  Bone density:  Lipid panel:  No Known Allergies  Current Outpatient Medications  Medication Sig Dispense Refill  . oxyCODONE-acetaminophen (PERCOCET/ROXICET) 5-325 MG tablet Take 1-2 tablets by mouth every 4 (four) hours as needed for moderate pain ((when tolerating fluids)). 30 tablet 0  .  tamoxifen (NOLVADEX) 20 MG tablet Take 1 tablet (20 mg total) by mouth daily. 30 tablet 3  . vitamin E 400 UNIT capsule Take 400 Units by mouth daily.    Marland Kitchen docusate sodium (COLACE) 100 MG capsule Take 1 capsule (100 mg total) by mouth daily as needed. (Patient not taking: Reported on 12/14/2017) 30 capsule 2    . gabapentin (NEURONTIN) 600 MG tablet Take 1 tablet (600 mg total) by mouth 3 (three) times daily. (Patient not taking: Reported on 12/14/2017) 90 tablet 0  . ibuprofen (ADVIL,MOTRIN) 800 MG tablet Take 1 tablet (800 mg total) by mouth every 8 (eight) hours as needed. (Patient not taking: Reported on 12/14/2017) 30 tablet 0  . ondansetron (ZOFRAN) 4 MG tablet Take 1 tablet (4 mg total) by mouth every 6 (six) hours as needed for nausea. (Patient not taking: Reported on 12/14/2017) 20 tablet 0  . simethicone (MYLICON) 80 MG chewable tablet Chew 1 tablet (80 mg total) by mouth 4 (four) times daily as needed for flatulence. (Patient not taking: Reported on 12/14/2017) 30 tablet 0   No current facility-administered medications for this visit.     OBJECTIVE: Vitals:   12/14/17 1419  BP: 109/72  Pulse: 76  Resp: 18  Temp: (!) 96.1 F (35.6 C)     Body mass index is 25.51 kg/m.    ECOG FS:0 - Asymptomatic  General: Well-developed, well-nourished, no acute distress. Eyes: Pink conjunctiva, anicteric sclera. HEENT: Normocephalic, moist mucous membranes. Breast: Patient requested exam be deferred today. Lungs: Clear to auscultation bilaterally. Heart: Regular rate and rhythm. No rubs, murmurs, or gallops. Abdomen: Soft, nontender, nondistended. No organomegaly noted, normoactive bowel sounds. Musculoskeletal: No edema, cyanosis, or clubbing. Neuro: Alert, answering all questions appropriately. Cranial nerves grossly intact. Skin: No rashes or petechiae noted. Psych: Normal affect.  LAB RESULTS:  Lab Results  Component Value Date   NA 137 09/19/2017   K 4.1 09/19/2017   CL 110 09/19/2017   CO2 20 (L) 09/19/2017   GLUCOSE 124 (H) 09/19/2017   BUN 10 09/19/2017   CREATININE 0.58 09/19/2017   CALCIUM 8.4 (L) 09/19/2017   PROT 7.5 08/09/2015   ALBUMIN 4.3 08/09/2015   AST 18 08/09/2015   ALT 13 (L) 08/09/2015   ALKPHOS 68 08/09/2015   BILITOT 0.7 08/09/2015   GFRNONAA >60 09/19/2017    GFRAA >60 09/19/2017    Lab Results  Component Value Date   WBC 11.6 (H) 09/19/2017   HGB 10.1 (L) 09/19/2017   HCT 29.5 (L) 09/19/2017   MCV 88.0 09/19/2017   PLT 210 09/19/2017     STUDIES: Mm Diag Breast Tomo Bilateral  Result Date: 11/28/2017 CLINICAL DATA:  Patient presents for bilateral diagnostic examination due to a left malignant lumpectomy September 2018.Patient also had excisional biopsy of fibroadenoma upper outer left breast September 2018. EXAM: DIGITAL DIAGNOSTIC BILATERAL MAMMOGRAM WITH CAD AND TOMO COMPARISON:  Previous exam(s). ACR Breast Density Category c: The breast tissue is heterogeneously dense, which may obscure small masses. FINDINGS: Examination demonstrates expected post lumpectomy changes over the inner lower left retroareolar region. There is expected postsurgical scarring over the upper-outer left breast. Remainder of the exam is unchanged. Mammographic images were processed with CAD. IMPRESSION: Expected postsurgical changes of the left breast as described. RECOMMENDATION: Recommend continued annual bilateral diagnostic mammographic evaluation. I have discussed the findings and recommendations with the patient. Results were also provided in writing at the conclusion of the visit. If applicable, a reminder letter will be sent to  the patient regarding the next appointment. BI-RADS CATEGORY  2: Benign. Electronically Signed   By: Marin Olp M.D.   On: 11/28/2017 14:11    ASSESSMENT: Left breast DCIS.  PLAN:    1. Left breast DCIS: Patient underwent lumpectomy on January 02, 2017. Final pathology did not reveal an invasive component.  Patient completed adjuvant XRT on March 21, 2017.  Although patient's breast pain is unlikely related to tamoxifen, she will hold treatment for 1 month.  If her symptoms do resolve we will consider placing patient on letrozole.  If no improvement, she will reinitiate tamoxifen and complete 5 years of treatment in December 2023.   Her most recent mammogram on November 28, 2017 was reported as BI-RADS 2.  Repeat in August 2020.  Return to clinic in 6 months for routine evaluation.   2. Family history: Patient reports her sister had breast cancer at the age of 76 and was treated at [facility]. By report, her genetic testing was negative.  Patient's genetic testing is negative as well. 3.  Left breast abnormality: Biopsy on June 01, 2017 was reported as fibroadenomatoid changes consistent with radiation therapy.  I spent a total of 30 minutes face-to-face with the patient of which greater than 50% of the visit was spent in counseling and coordination of care as detailed above.   The entire visit was done in the presence of an interpreter.  Patient expressed understanding and was in agreement with this plan. She also understands that She can call clinic at any time with any questions, concerns, or complaints.   Cancer Staging Ductal carcinoma in situ (DCIS) of left breast Staging form: Breast, AJCC 8th Edition - Clinical stage from 12/15/2016: Stage 0 (cTis (DCIS), cN0, cM0, ER: Positive, PR: Positive, HER2: Negative) - Signed by Lloyd Huger, MD on 12/15/2016 - Pathologic stage from 01/09/2017: Stage 0 (pTis (DCIS), pN0, cM0, ER: Positive, PR: Positive, HER2: Negative) - Signed by Lloyd Huger, MD on 01/09/2017   Lloyd Huger, MD   12/17/2017 7:19 PM

## 2017-12-12 ENCOUNTER — Ambulatory Visit (INDEPENDENT_AMBULATORY_CARE_PROVIDER_SITE_OTHER): Payer: Managed Care, Other (non HMO) | Admitting: Surgery

## 2017-12-12 ENCOUNTER — Encounter: Payer: Self-pay | Admitting: Surgery

## 2017-12-12 VITALS — BP 112/68 | HR 73 | Resp 12 | Ht 61.0 in | Wt 134.2 lb

## 2017-12-12 DIAGNOSIS — N644 Mastodynia: Secondary | ICD-10-CM | POA: Diagnosis not present

## 2017-12-12 DIAGNOSIS — M79622 Pain in left upper arm: Secondary | ICD-10-CM | POA: Diagnosis not present

## 2017-12-12 DIAGNOSIS — D0512 Intraductal carcinoma in situ of left breast: Secondary | ICD-10-CM | POA: Diagnosis not present

## 2017-12-12 NOTE — Progress Notes (Signed)
12/12/2017  History of Present Illness: Sherry Short is a 42 y.o. female with a history of left breast DCIS s/p left lumpectomy with Dr. Adonis Huguenin in 12/2016.  She presents for follow up.  She was seen on 8/7 by Dr. Dahlia Byes due to worsening left chest wall and axillary pain.  She had developed pain in the left chest wall a few weeks after radiation.  She had an intercostal nerve block injection by Dr. Dahlia Byes and patient reports that this helped for about a week.  She also had a prescription for Gabapentin which she states made her feel dizzy so she stopped the medication.  She had a mammogram on 8/20 and presents to review results.  Past Medical History: Past Medical History:  Diagnosis Date  . Anginal pain ([place]) /   chest pain probably related to anxiety, per pt.  . Anxiety   . Cancer ([place]) 12/13/2016   Left Breast (DCIS)  . Migraine headache 08/2017   tylenol and pain medicine helps this  . Personal history of radiation therapy   . Seasonal allergies      Past Surgical History: Past Surgical History:  Procedure Laterality Date  . BREAST BIOPSY Left 11/30/2016   left breast calcs +  . BREAST BIOPSY Left 12/13/2016   Left Affrim Bx-+  . BREAST BIOPSY Left 12/13/2016   Left Affirm Bx- Path pending  . BREAST BIOPSY Left 06/01/2017   Left Affirm- Path pending  . BREAST LUMPECTOMY Left 01/02/2017   Needle localization and lymph node disection  . BREAST LUMPECTOMY WITH SENTINEL LYMPH NODE BIOPSY Left 01/02/2017   Procedure: BREAST LUMPECTOMY WITH SENTINEL LYMPH NODE BX;  Surgeon: Clayburn Pert, MD;  Location: [facility] ORS;  Service: General;  Laterality: Left;  . LAPAROSCOPIC OVARIAN CYSTECTOMY Left 09/18/2017   Procedure: LAPAROSCOPIC OVARIAN CYSTECTOMY (x 2);  Surgeon: Schermerhorn, Gwen Her, MD;  Location: [facility] ORS;  Service: Gynecology;  Laterality: Left;  . LAPAROSCOPIC VAGINAL HYSTERECTOMY WITH SALPINGECTOMY Bilateral 09/18/2017   Procedure: LAPAROSCOPIC ASSISTED VAGINAL  HYSTERECTOMY WITH SALPINGECTOMY;  Surgeon: Schermerhorn, Gwen Her, MD;  Location: [facility] ORS;  Service: Gynecology;  Laterality: Bilateral;  . TUBAL LIGATION      Home Medications: Prior to Admission medications   Medication Sig Start Date End Date Taking? Authorizing Provider  tamoxifen (NOLVADEX) 20 MG tablet Take 1 tablet (20 mg total) by mouth daily. 12/07/17  Yes Lloyd Huger, MD  vitamin E 400 UNIT capsule Take 400 Units by mouth daily.   Yes [provider]  docusate sodium (COLACE) 100 MG capsule Take 1 capsule (100 mg total) by mouth daily as needed. Patient not taking: Reported on 12/12/2017 09/19/17 09/19/18  Schermerhorn, Gwen Her, MD  gabapentin (NEURONTIN) 600 MG tablet Take 1 tablet (600 mg total) by mouth 3 (three) times daily. Patient not taking: Reported on 12/12/2017 11/15/17   Caroleen Hamman F, MD  ibuprofen (ADVIL,MOTRIN) 800 MG tablet Take 1 tablet (800 mg total) by mouth every 8 (eight) hours as needed. Patient not taking: Reported on 12/12/2017 09/19/17   Schermerhorn, Gwen Her, MD  ondansetron (ZOFRAN) 4 MG tablet Take 1 tablet (4 mg total) by mouth every 6 (six) hours as needed for nausea. Patient not taking: Reported on 12/12/2017 09/19/17   Schermerhorn, Gwen Her, MD  oxyCODONE-acetaminophen (PERCOCET/ROXICET) 5-325 MG tablet Take 1-2 tablets by mouth every 4 (four) hours as needed for moderate pain ((when tolerating fluids)). Patient not taking: Reported on 12/12/2017 09/19/17   Schermerhorn, Gwen Her, MD  simethicone [facility])  80 MG chewable tablet Chew 1 tablet (80 mg total) by mouth 4 (four) times daily as needed for flatulence. Patient not taking: Reported on 12/12/2017 09/19/17   Schermerhorn, Gwen Her, MD    Allergies: No Known Allergies  Review of Systems: Review of Systems  Constitutional: Negative for chills and fever.  Respiratory: Negative for shortness of breath.   Cardiovascular: Negative for chest pain.  Musculoskeletal:       Left chest wall pain.   Neurological: Positive for dizziness (after taking Neurontin).    Physical Exam BP 112/68   Pulse 73   Resp 12   Ht 5\' 1"  (1.549 m)   Wt 134 lb 3.2 oz (60.9 kg)   LMP 09/04/2017 (Exact Date)   SpO2 99%   BMI 25.36 kg/m  CONSTITUTIONAL: No acute distress RESPIRATORY:  Lungs are clear, and breath sounds are equal bilaterally. Normal respiratory effort without pathologic use of accessory muscles. CARDIOVASCULAR: Heart is regular without murmurs, gallops, or rubs. BREAST:  Right breast without any palpable masses, tenderness, discharge, or skin changes.  No right axillary lymphadenopathy.  Left breast with two well healed scars from sentinel node bx and lumpectomy.  There is a very small 8 mm nodule in the periareolar area at 11 o clock, soft, nontender.  The patient does have tenderness at the left chest wall going from clavicle to inframammary fold and from lateral edge of sternum to lateral edge of breast and axilla at the sentinel node site.  No skin changes or nipple discharge. PSYCH:  Normal affect and mood NEURO:  CN 2-12 grossly intact.  Labs/Imaging: FINDINGS: Examination demonstrates expected post lumpectomy changes over the inner lower left retroareolar region. There is expected postsurgical scarring over the upper-outer left breast. Remainder of the exam is unchanged.  Mammographic images were processed with CAD.  IMPRESSION: Expected postsurgical changes of the left breast as described.  RECOMMENDATION: Recommend continued annual bilateral diagnostic mammographic evaluation.  Assessment and Plan: This is a 42 y.o. female s/p left lumpectomy and sentinel node biopsy in 12/2016, s/p radiation, with chronic left chest wall / axillary pain.  Discussed with the patient the role for referral to pain clinic for further evaluation and possible further injections or medication management for her chronic pain.  Her mammogram was reviewed and there are no new findings,  particularly only post-op changes and no new masses.  Given that the pain is more diffuse along the chest wall and axilla, it may be more related to radiation changes.  Patient will follow up in 6 months for another physical exam and 1 year for repeat mammogram.   Melvyn Neth, MD [facility]

## 2017-12-12 NOTE — Patient Instructions (Addendum)
Patient has been referred to pain clinic for left breast chronic pain. She is to follow up in 6 months. Repeat bilateral diagnostic mammogram with [place] in 1 year.

## 2017-12-14 ENCOUNTER — Other Ambulatory Visit: Payer: Self-pay

## 2017-12-14 ENCOUNTER — Inpatient Hospital Stay: Payer: Managed Care, Other (non HMO) | Attending: Oncology | Admitting: Oncology

## 2017-12-14 VITALS — BP 109/72 | HR 76 | Temp 96.1°F | Resp 18 | Wt 135.0 lb

## 2017-12-14 DIAGNOSIS — Z79810 Long term (current) use of selective estrogen receptor modulators (SERMs): Secondary | ICD-10-CM | POA: Insufficient documentation

## 2017-12-14 DIAGNOSIS — Z803 Family history of malignant neoplasm of breast: Secondary | ICD-10-CM | POA: Diagnosis not present

## 2017-12-14 DIAGNOSIS — D0512 Intraductal carcinoma in situ of left breast: Secondary | ICD-10-CM | POA: Diagnosis not present

## 2017-12-14 DIAGNOSIS — D242 Benign neoplasm of left breast: Secondary | ICD-10-CM | POA: Diagnosis not present

## 2017-12-14 DIAGNOSIS — Z923 Personal history of irradiation: Secondary | ICD-10-CM | POA: Insufficient documentation

## 2017-12-14 NOTE — Progress Notes (Signed)
Here for follow up. Per pt has" level 10 pain now-L breast  Stated she has been having this same pain since January. Stated she treats pain w "cream and pills that dont work "

## 2018-04-09 ENCOUNTER — Ambulatory Visit: Payer: Managed Care, Other (non HMO) | Attending: Radiation Oncology | Admitting: Radiation Oncology

## 2018-05-13 ENCOUNTER — Other Ambulatory Visit: Payer: Self-pay | Admitting: Oncology

## 2018-06-13 ENCOUNTER — Ambulatory Visit: Payer: Managed Care, Other (non HMO) | Admitting: Surgery

## 2018-06-17 NOTE — Progress Notes (Signed)
[place]  Telephone:(336) [phone] Fax:(336) [phone]  ID: Sherry Short OB: 08/13/1975  MR#: [id number]  URK#:[id number]  Patient Care Team: Katheren Shams as PCP - General  CHIEF COMPLAINT: Left breast DCIS.  INTERVAL HISTORY: Patient returns to clinic today for routine 25-monthevaluation.  She continues to have persistent left breast pain, but otherwise feels well. She has no neurologic complaints. She denies any recent fevers or illnesses. She has a good appetite and denies weight loss. She denies any other pain. She has no chest pain or shortness of breath. She denies any nausea, vomiting, constipation, or diarrhea. She has no urinary complaints.  Patient offers no further specific complaints today.  REVIEW OF SYSTEMS:   Review of Systems  Constitutional: Negative.  Negative for fever, malaise/fatigue and weight loss.  Respiratory: Negative.  Negative for cough and shortness of breath.   Cardiovascular: Negative.  Negative for chest pain and leg swelling.  Gastrointestinal: Negative.  Negative for abdominal pain.  Genitourinary: Negative.  Negative for dysuria.  Musculoskeletal: Negative.  Negative for back pain.  Skin: Negative.  Negative for rash.  Neurological: Negative.  Negative for dizziness, sensory change, weakness and headaches.  Psychiatric/Behavioral: Negative.  The patient is not nervous/anxious.     As per HPI. Otherwise, a complete review of systems is negative.  PAST MEDICAL HISTORY: Past Medical History:  Diagnosis Date  . [place] /   chest pain probably related to anxiety, per pt.  . Anxiety   . [place]   Left Breast (DCIS)  . Migraine headache 08/2017   tylenol and pain medicine helps this  . Personal history of radiation therapy   . Seasonal allergies     PAST SURGICAL HISTORY: Past Surgical History:  Procedure Laterality Date  . BREAST BIOPSY Left 11/30/2016   left breast calcs +  . BREAST  BIOPSY Left 12/13/2016   Left Affrim Bx-+  . BREAST BIOPSY Left 12/13/2016   Left Affirm Bx- Path pending  . BREAST BIOPSY Left 06/01/2017   Left Affirm- Path pending  . BREAST LUMPECTOMY Left 01/02/2017   Needle localization and lymph node disection  . BREAST LUMPECTOMY WITH SENTINEL LYMPH NODE BIOPSY Left 01/02/2017   Procedure: BREAST LUMPECTOMY WITH SENTINEL LYMPH NODE BX;  Surgeon: WClayburn Pert MD;  Location: [facility] ORS;  Service: General;  Laterality: Left;  . LAPAROSCOPIC OVARIAN CYSTECTOMY Left 09/18/2017   Procedure: LAPAROSCOPIC OVARIAN CYSTECTOMY (x 2);  Surgeon: Schermerhorn, TGwen Her MD;  Location: [facility] ORS;  Service: Gynecology;  Laterality: Left;  . LAPAROSCOPIC VAGINAL HYSTERECTOMY WITH SALPINGECTOMY Bilateral 09/18/2017   Procedure: LAPAROSCOPIC ASSISTED VAGINAL HYSTERECTOMY WITH SALPINGECTOMY;  Surgeon: Schermerhorn, TGwen Her MD;  Location: [facility] ORS;  Service: Gynecology;  Laterality: Bilateral;  . TUBAL LIGATION      FAMILY HISTORY: Family History  Problem Relation Age of Onset  . Breast cancer Sister 434 . Healthy Mother   . Diabetes Father     ADVANCED DIRECTIVES (Y/N):  N  HEALTH MAINTENANCE: Social History   Tobacco Use  . Smoking status: Never Smoker  . Smokeless tobacco: Never Used  Substance Use Topics  . Alcohol use: No  . Drug use: No     Colonoscopy:  PAP:  Bone density:  Lipid panel:  No Known Allergies  Current Outpatient Medications  Medication Sig Dispense Refill  . tamoxifen (NOLVADEX) 20 MG tablet TAKE 1 TABLET(20 MG) BY MOUTH DAILY 30 tablet 3   No current facility-administered medications for this visit.  OBJECTIVE: Vitals:   06/21/18 1510  BP: 105/72  Pulse: 78  Temp: 97.9 F (36.6 C)     Body mass index is 25.18 kg/m.    ECOG FS:0 - Asymptomatic  General: Well-developed, well-nourished, no acute distress. Eyes: Pink conjunctiva, anicteric sclera. HEENT: Normocephalic, moist mucous membranes. Breast: Bilateral  breast and axilla without lumps or masses.  Left breast with significant radiation changes. Lungs: Clear to auscultation bilaterally. Heart: Regular rate and rhythm. No rubs, murmurs, or gallops. Abdomen: Soft, nontender, nondistended. No organomegaly noted, normoactive bowel sounds. Musculoskeletal: No edema, cyanosis, or clubbing. Neuro: Alert, answering all questions appropriately. Cranial nerves grossly intact. Skin: No rashes or petechiae noted. Psych: Normal affect.  LAB RESULTS:  Lab Results  Component Value Date   NA 137 09/19/2017   K 4.1 09/19/2017   CL 110 09/19/2017   CO2 20 (L) 09/19/2017   GLUCOSE 124 (H) 09/19/2017   BUN 10 09/19/2017   CREATININE 0.58 09/19/2017   CALCIUM 8.4 (L) 09/19/2017   PROT 7.5 08/09/2015   ALBUMIN 4.3 08/09/2015   AST 18 08/09/2015   ALT 13 (L) 08/09/2015   ALKPHOS 68 08/09/2015   BILITOT 0.7 08/09/2015   GFRNONAA >60 09/19/2017   GFRAA >60 09/19/2017    Lab Results  Component Value Date   WBC 11.6 (H) 09/19/2017   HGB 10.1 (L) 09/19/2017   HCT 29.5 (L) 09/19/2017   MCV 88.0 09/19/2017   PLT 210 09/19/2017     STUDIES: No results found.  ASSESSMENT: Left breast DCIS.  PLAN:    1. Left breast DCIS: Patient underwent lumpectomy on January 02, 2017. Final pathology did not reveal an invasive component.  Patient completed adjuvant XRT on March 21, 2017.  Patient's left breast pain is likely secondary to XRT.  Mammogram this morning at [facility] was reported as BI-RADS 2.  Continue tamoxifen for a total of 5 years completing in December 2023.  Repeat mammogram in March 2021.  Return to clinic in 6 months for routine evaluation.    2. Family history: Patient reports her sister had breast cancer at the age of 60 and was treated at [facility]. By report, her genetic testing was negative.  Patient's genetic testing is negative as well. 3.  Left breast abnormality: Biopsy on June 01, 2017 was reported as fibroadenomatoid changes consistent  with radiation therapy. 4.  Left breast pain: Likely secondary to radiation.  Patient was given topical treatments by [facility].  If this does not work, she has been instructed to call clinic and we will try a short course of steroids.  I spent a total of 30 minutes face-to-face with the patient of which greater than 50% of the visit was spent in counseling and coordination of care as detailed above.   The entire visit was done in the presence of an interpreter.  Patient expressed understanding and was in agreement with this plan. She also understands that She can call clinic at any time with any questions, concerns, or complaints.   Cancer Staging Ductal carcinoma in situ (DCIS) of left breast Staging form: Breast, AJCC 8th Edition - Clinical stage from 12/15/2016: Stage 0 (cTis (DCIS), cN0, cM0, ER: Positive, PR: Positive, HER2: Negative) - Signed by Lloyd Huger, MD on 12/15/2016 - Pathologic stage from 01/09/2017: Stage 0 (pTis (DCIS), pN0, cM0, ER: Positive, PR: Positive, HER2: Negative) - Signed by Lloyd Huger, MD on 01/09/2017   Lloyd Huger, MD   06/23/2018 9:09 AM

## 2018-06-21 ENCOUNTER — Encounter: Payer: Self-pay | Admitting: Oncology

## 2018-06-21 ENCOUNTER — Other Ambulatory Visit: Payer: Self-pay

## 2018-06-21 ENCOUNTER — Inpatient Hospital Stay: Payer: Managed Care, Other (non HMO) | Attending: Oncology | Admitting: Oncology

## 2018-06-21 VITALS — BP 105/72 | HR 78 | Temp 97.9°F | Wt 133.2 lb

## 2018-06-21 DIAGNOSIS — N644 Mastodynia: Secondary | ICD-10-CM | POA: Diagnosis not present

## 2018-06-21 DIAGNOSIS — D0512 Intraductal carcinoma in situ of left breast: Secondary | ICD-10-CM | POA: Diagnosis not present

## 2018-06-21 DIAGNOSIS — Z79811 Long term (current) use of aromatase inhibitors: Secondary | ICD-10-CM | POA: Insufficient documentation

## 2018-06-21 DIAGNOSIS — F419 Anxiety disorder, unspecified: Secondary | ICD-10-CM | POA: Insufficient documentation

## 2018-06-21 NOTE — Progress Notes (Signed)
Patient here today for follow up.  Patient c/o of left breast, arm pit and shoulder pain

## 2018-09-24 ENCOUNTER — Encounter: Payer: Self-pay | Admitting: *Deleted

## 2018-10-15 IMAGING — [facility] BREAST LOC DEV 1ST LESION INC MAMMO GUIDE*L*
7 series · 7 of 7 positions shown · non-contrast
Comparison: Previous exams.

CLINICAL DATA: Recently diagnosed ductal carcinoma in situ in the
lower inner retroareolar left breast. The patient also has had
recent biopsies of 2 additional groups of indeterminate
calcifications in the left breast. These demonstrated discordant
benign results with significant clip migration involving one of
these. Re-biopsy of both of these areas was recommended but not
performed at this time. She also had an ultrasound-guided core
needle biopsy of a mass in the 1 o'clock position of the left breast
demonstrating a fibroepithelial lesion with a differential diagnosis
including fibroadenoma and phyllodes tumor. Due to the possibility
of phyllodes tumor, surgical excision of this mass was recommended.
The need for re-biopsy of the 2 areas of indeterminate
calcifications and excision of the fibroepithelial lesion was
discussed with Dr. Youngest prior to today's procedure. He requested
needle localization of the recently diagnosed ductal carcinoma in
situ and will address the other 3 areas later.

EXAM:
NEEDLE LOCALIZATION OF THE LEFT BREAST WITH MAMMO GUIDANCE

[L FB (1 of 4)]
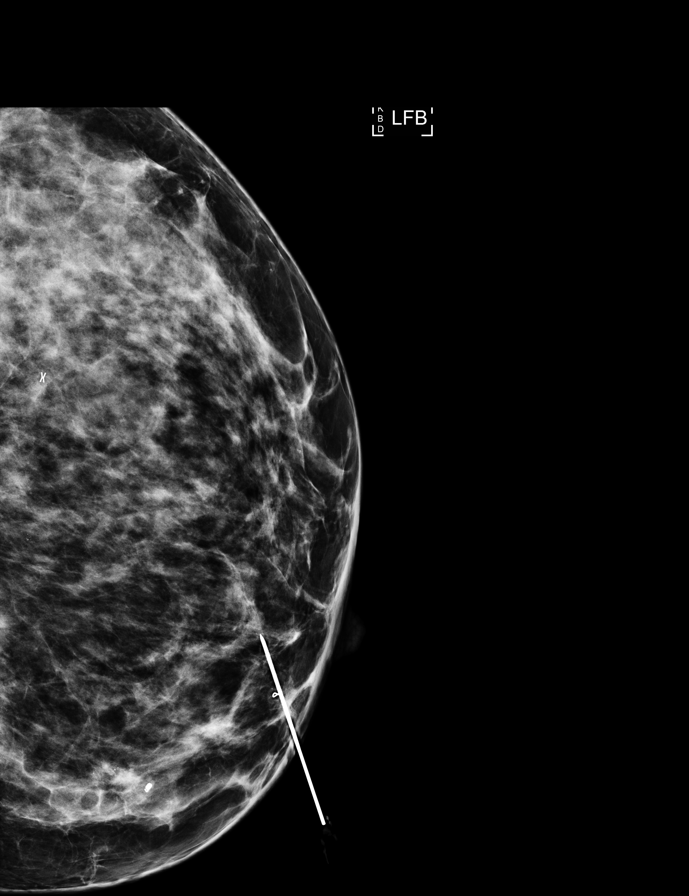

[L LM (1 of 3)]
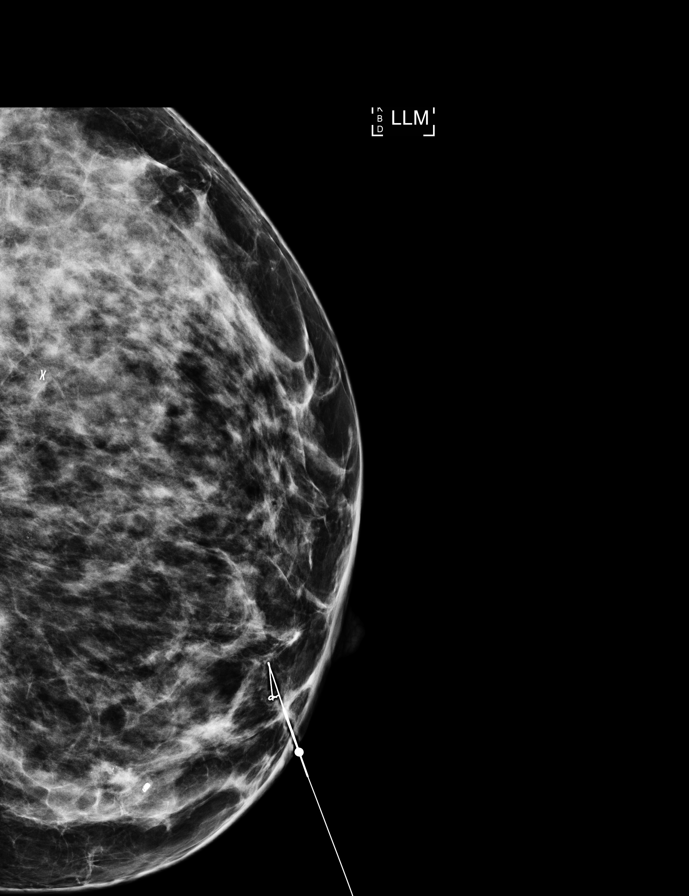

[L FB (2 of 4)]
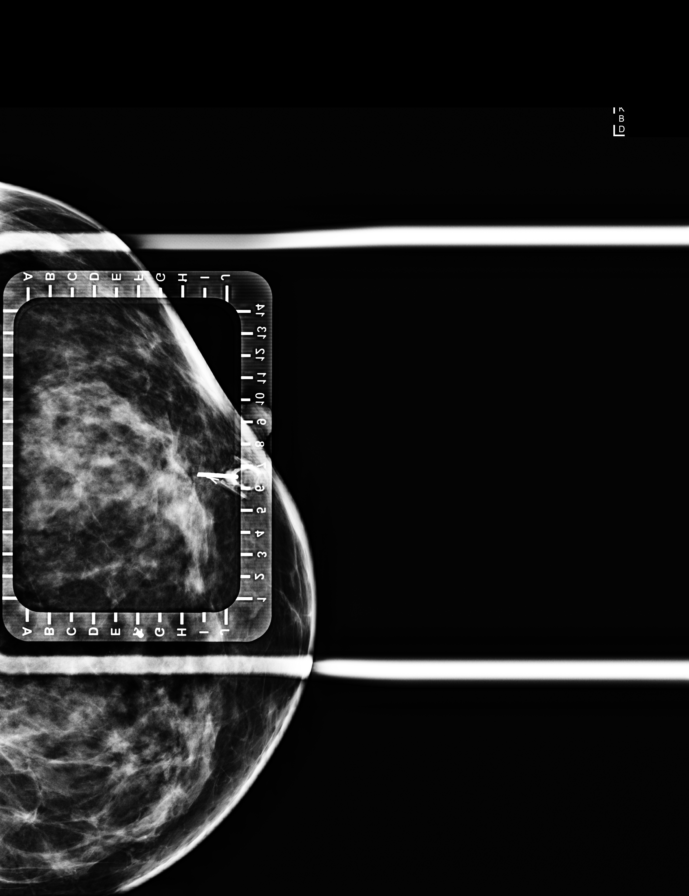

[L LM (2 of 3)]
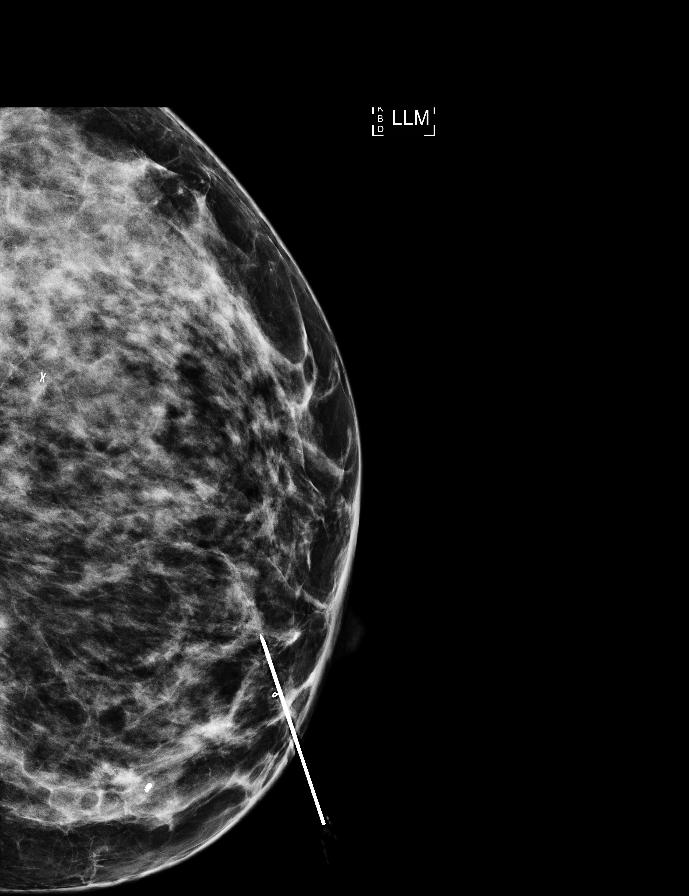

[L FB (3 of 4)]
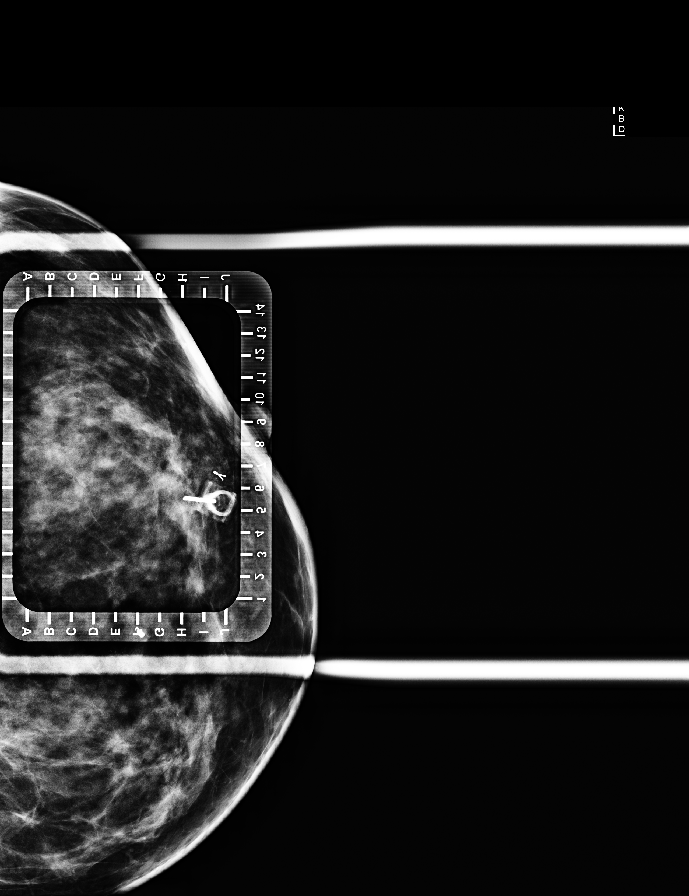

[L LM (3 of 3)]
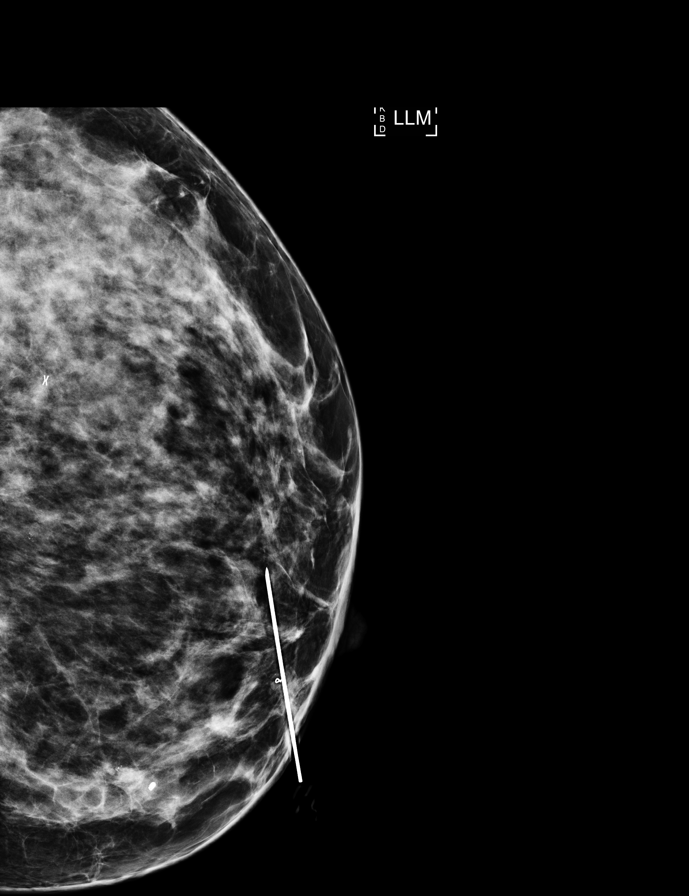

[L FB (4 of 4)]
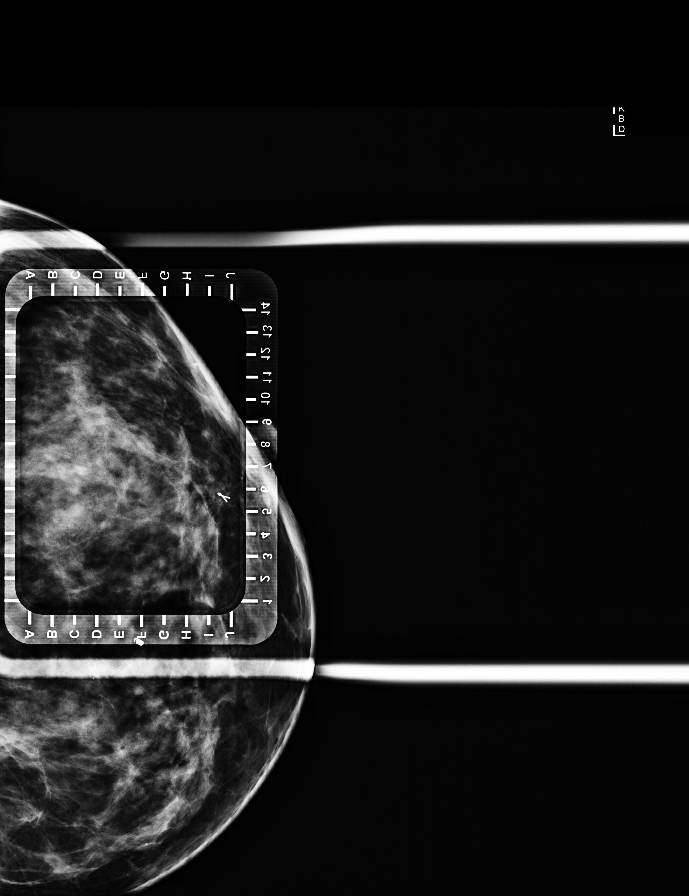

[7 of 7 positions shown; findings below may reference images not displayed]

FINDINGS: Patient presents for needle localization prior to left lumpectomy. I
met with the patient and we discussed the procedure of needle
localization including benefits and alternatives. We discussed the
high likelihood of a successful procedure. We discussed the risks of
the procedure, including infection, bleeding, tissue injury, and
further surgery. Informed, written consent was given. The usual
time-out protocol was performed immediately prior to the procedure.

Using mammographic guidance, sterile technique, 1% lidocaine and a 5
cm modified Kopans needle, the recently placed ribbon shaped biopsy
marker clip in the lower inner retroareolar left breast was
localized using an inferior approach. The images were marked for Dr.
Youngest.
IMPRESSION: Needle localization left breast. No apparent complications.

## 2018-10-17 ENCOUNTER — Other Ambulatory Visit: Payer: Self-pay | Admitting: Oncology

## 2018-10-29 ENCOUNTER — Other Ambulatory Visit: Payer: Self-pay | Admitting: *Deleted

## 2018-10-29 DIAGNOSIS — D0512 Intraductal carcinoma in situ of left breast: Secondary | ICD-10-CM

## 2018-12-06 ENCOUNTER — Ambulatory Visit
Admission: RE | Admit: 2018-12-06 | Discharge: 2018-12-06 | Disposition: A | Discharge status: Home / Self Care | Payer: Medicaid Other | Source: Ambulatory Visit | Attending: Surgery | Admitting: Surgery

## 2018-12-06 DIAGNOSIS — D0512 Intraductal carcinoma in situ of left breast: Secondary | ICD-10-CM

## 2018-12-21 ENCOUNTER — Ambulatory Visit (INDEPENDENT_AMBULATORY_CARE_PROVIDER_SITE_OTHER): Payer: Self-pay | Admitting: Surgery

## 2018-12-21 ENCOUNTER — Encounter: Payer: Self-pay | Admitting: Surgery

## 2018-12-21 ENCOUNTER — Other Ambulatory Visit: Payer: Self-pay

## 2018-12-21 VITALS — BP 131/92 | HR 84 | Temp 97.5°F | Resp 16 | Ht 63.0 in | Wt 137.8 lb

## 2018-12-21 DIAGNOSIS — D0512 Intraductal carcinoma in situ of left breast: Secondary | ICD-10-CM

## 2018-12-21 DIAGNOSIS — N644 Mastodynia: Secondary | ICD-10-CM

## 2018-12-21 NOTE — Progress Notes (Signed)
12/21/2018  History of Present Illness: Sherry Short is a 43 y.o. 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 Adonis Huguenin.  She also had a left breast excisional biopsy in February 2019 for fibroadenomatoid changes and radiation changes.  Unfortunately patient has been having issues with chronic left breast and axillary pain have been ongoing since radiation had completed.  She has seen a Psychologist, sport and exercise at [facility] for second opinion and they agreed that this was due to radiation changes.  She reports that she still having pain and this is diffuse of along the left breast going to the axilla and now is also causing her discomfort in the left shoulder.  She works doing a lot of hand and arm movements and she feels that this is affecting her work as well.  From the surgical standpoint, she also reports that she has been feeling a small cyst that is been growing in size over the last couple of years and it was seen on mammogram most recently on 12/06/2018 which was felt to be an oil cyst but no evidence of malignancy.  Past Medical History: Past Medical History:  Diagnosis Date  . Anginal pain ([place]) /   chest pain probably related to anxiety, per pt.  . Anxiety   . Cancer ([place]) 12/13/2016   Left Breast (DCIS)  . Migraine headache 08/2017   tylenol and pain medicine helps this  . Personal history of radiation therapy 2018   left breast DCIS  . Seasonal allergies      Past Surgical History: Past Surgical History:  Procedure Laterality Date  . BREAST BIOPSY Left 11/30/2016   left breast calcs LIQ high grade DCIS.  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 BREAST BIOPSY Left 12/13/2016   Left Affrim Bx central calcs,  Ususal ductal hyperplasia  . BREAST BIOPSY Left 12/13/2016   Left Affirm Bx- LIQ calcs, columnar cell change, disconcordant and excision recommened.   Marland Kitchen BREAST BIOPSY Left 06/01/2017   Left  Affirm- of LIQ calcs, re bx of area biopsied in 12/2016 that was disconcordant, epithelial changes, CALCIFICATION ASSOCIATED WITH RADIATION EFFECT and fibroadenomatoid.   Marland Kitchen BREAST LUMPECTOMY Left 01/02/2017   DCIS and fibroadenoma with phyllodes excisied. SN bx, LN negative  . BREAST LUMPECTOMY WITH SENTINEL LYMPH NODE BIOPSY Left 01/02/2017   Procedure: BREAST LUMPECTOMY WITH SENTINEL LYMPH NODE BX;  Surgeon: Clayburn Pert, MD;  Location: [facility] ORS;  Service: General;  Laterality: Left;  . LAPAROSCOPIC OVARIAN CYSTECTOMY Left 09/18/2017   Procedure: LAPAROSCOPIC OVARIAN CYSTECTOMY (x 2);  Surgeon: Schermerhorn, Gwen Her, MD;  Location: [facility] ORS;  Service: Gynecology;  Laterality: Left;  . LAPAROSCOPIC VAGINAL HYSTERECTOMY WITH SALPINGECTOMY Bilateral 09/18/2017   Procedure: LAPAROSCOPIC ASSISTED VAGINAL HYSTERECTOMY WITH SALPINGECTOMY;  Surgeon: Schermerhorn, Gwen Her, MD;  Location: [facility] ORS;  Service: Gynecology;  Laterality: Bilateral;  . TUBAL LIGATION      Home Medications: Prior to Admission medications   Medication Sig Start Date End Date Taking? Authorizing Provider  tamoxifen (NOLVADEX) 20 MG tablet TAKE 1 TABLET(20 MG) BY MOUTH DAILY 10/17/18  Yes Lloyd Huger, MD    Allergies: No Known Allergies  Review of Systems: Review of Systems  Constitutional: Negative for chills and fever.  Respiratory: Negative for shortness of breath.   Cardiovascular: Negative for chest pain.  Gastrointestinal: Negative for nausea and vomiting.  Musculoskeletal: Positive for joint pain.  Skin: Negative for  rash.    Physical Exam BP (!) 131/92   Pulse 84   Temp (!) 97.5 F (36.4 C) (Temporal)   Resp 16   Ht 5\' 3"  (1.6 m)   Wt 137 lb 12.8 oz (62.5 kg)   LMP 09/04/2017 (Exact Date)   SpO2 99%   BMI 24.41 kg/m  CONSTITUTIONAL: No acute distress HEENT:  Normocephalic, atraumatic, extraocular motion intact. RESPIRATORY:  Lungs are clear, and breath sounds are equal bilaterally. Normal  respiratory effort without pathologic use of accessory muscles. CARDIOVASCULAR: Heart is regular without murmurs, gallops, or rubs. BREAST: Left breast status post lumpectomy and excisional biopsy with incisions well-healed.  Patient has mild diffuse tenderness along the breast in all quadrants but particularly in the left upper outer quadrant going towards the axilla.  Patient has a palpable small cyst at the 12 o'clock position about 2 cm from the nipple which was found on mammogram consistent with a cyst.  No other palpable masses, other skin changes outside of radiation changes or nipple changes.  There is no left axillary or supraclavicular lymphadenopathy.  On the right side, there is no palpable masses, skin changes, nipple changes, or any right axillary or supraclavicular lymphadenopathy. NEUROLOGIC:  Motor and sensation is grossly normal.  Cranial nerves are grossly intact. PSYCH:  Alert and oriented to person, place and time. Affect is normal.  Labs/Imaging: Mammogram 12/06/18: FINDINGS: The patient is palpating an oil cyst in the anterior left breast. The lumpectomy site is stable. No suspicious masses, calcifications, or distortion identified in either breast.  Mammographic images were processed with CAD.  IMPRESSION: No mammographic evidence of malignancy. Stable left lumpectomy site.  RECOMMENDATION: Annual diagnostic mammography.  Assessment and Plan: This is a 43 y.o. female status post left breast lumpectomy for DCIS in September 2018 followed by left breast excisional biopsy in 05/2017 for fibroadenomatoid changes.  -Discussed with the patient that unfortunately the pain that she is having is chronic in nature and there is not much that can be done from the surgical standpoint.  She did try Neurontin earlier but this caused dizziness.  As such at this point we will send a referral to the pain clinic here so she can be seen and discuss potential options whether they be  injections, or other medications. -She will follow-up with [place] in 1 year with bilateral diagnostic mammograms.  Face-to-face time spent with the patient and care providers was 15 minutes, with more than 50% of the time spent counseling, educating, and coordinating care of the patient.     Melvyn Neth, [facility]

## 2018-12-21 NOTE — Patient Instructions (Signed)
We will contact you August 2021 to have your Mammogram September 2021.  We have sent the referral to the pain clinic and someone from their office will call to schedule an appointment within 7 days. If you do not hear from them please call our office so we can heck on this for you.

## 2018-12-27 NOTE — Progress Notes (Signed)
  [facility]  [phone] [phone]  ID: Sherry Short OB: July 31, 1975  MR#: [id number]  QIX#:[id number]  Patient Care Team: Medicine, [facility]ven[facility] H[facility] Family as PCP - General     Cancer Staging Ductal carcinoma in situ (DCIS) of left breast Staging form: Breast, AJCC 8th Edition - Clinical stage from 12/15/2016: Stage 0 (cTis (DCIS), cN0, cM0, ER: Positive, PR: Positive, HER2: Negative) - Signed by Lloyd Huger, MD on 12/15/2016 - Pathologic stage from 01/09/2017: Stage 0 (pTis (DCIS), pN0, cM0, ER: Positive, PR: Positive, HER2: Negative) - Signed by Lloyd Huger, MD on 01/09/2017   Lloyd Huger, MD   12/27/2018 11:21 PM     This encounter was created in error - please disregard.

## 2019-01-01 ENCOUNTER — Inpatient Hospital Stay: Payer: Medicaid Other | Admitting: Oncology

## 2019-01-16 ENCOUNTER — Telehealth: Payer: Self-pay | Admitting: *Deleted

## 2019-01-16 NOTE — Telephone Encounter (Signed)
piscoya's note from 9/11 states they were sending a referral.  Did that not happen?  We can, but do not want to double you work.

## 2019-01-16 NOTE — Telephone Encounter (Signed)
Patient requesting a referral to Pain Clinic, they would not take her as patient without a referral

## 2019-01-17 NOTE — Telephone Encounter (Signed)
Referral was sent per Dr Abelardo Diesel office to Dr Holley Raring at [facility] but they were unable to reach her for appointment. I called patient back to inform her of this and got her voice mail and left message to call [phone]

## 2019-07-03 ENCOUNTER — Telehealth: Payer: Self-pay | Admitting: Surgery

## 2019-07-03 NOTE — Telephone Encounter (Signed)
Spoke with patient and notified her that her referral to the pain clinic was sent over back on 12/24/18. Pain Clinic has been trying to contact patient on several attempts and have not been able to reach patient due to mailbox. Patient was notified of the information as well. Patient thought that she needed a physical piece of paper to request an appointment. I informed patient referral was sent via electronically. Patient was also given the contact information to the Pain Clinic to get scheduled for an appointment. Patient verbalized understanding and had no further questions.

## 2019-07-03 NOTE — Telephone Encounter (Signed)
Patient is calling asking if she can get a referral to the pain clinic. Please call patient and advise.

## 2019-07-17 ENCOUNTER — Ambulatory Visit: Payer: Medicaid Other | Attending: Internal Medicine

## 2019-07-17 DIAGNOSIS — Z23 Encounter for immunization: Secondary | ICD-10-CM

## 2019-07-17 NOTE — Progress Notes (Signed)
   Covid-19 Vaccination Clinic  Name:  Sherry Short    MRN: [id number] DOB: October 03, 1975  07/17/2019  [name] was observed post Covid-19 immunization for 15 minutes without incident. She was provided with Vaccine Information Sheet and instruction to access the V-Safe system.   [name] was instructed to call 911 with any severe reactions post vaccine: Marland Kitchen Difficulty breathing  . Swelling of face and throat  . A fast heartbeat  . A bad rash all over body  . Dizziness and weakness   Immunizations Administered    Name Date Dose VIS Date Route   Moderna COVID-19 Vaccine 07/17/2019 10:01 AM 0.5 mL 03/12/2019 Intramuscular   Manufacturer: Moderna   Lot: [id number]   [id number]

## 2019-08-07 ENCOUNTER — Ambulatory Visit: Payer: Medicaid Other | Admitting: Surgery

## 2019-08-14 ENCOUNTER — Ambulatory Visit: Payer: Medicaid Other | Attending: Internal Medicine

## 2019-08-14 DIAGNOSIS — Z23 Encounter for immunization: Secondary | ICD-10-CM

## 2019-08-14 NOTE — Progress Notes (Signed)
   Covid-19 Vaccination Clinic  Name:  Sherry Short    MRN: [id number] DOB: [birth year]  08/14/2019  [name] was observed post Covid-19 immunization for 15 minutes without incident. She was provided with Vaccine Information Sheet and instruction to access the V-Safe system.   [name] was instructed to call 911 with any severe reactions post vaccine: Marland Kitchen Difficulty breathing  . Swelling of face and throat  . A fast heartbeat  . A bad rash all over body  . Dizziness and weakness   Immunizations Administered    Name Date Dose VIS Date Route   Moderna COVID-19 Vaccine 08/14/2019 11:27 AM 0.5 mL 03/2019 Intramuscular   Manufacturer: Moderna   Lot: [id number]   [place]:[id number]

## 2019-08-22 ENCOUNTER — Other Ambulatory Visit: Payer: Self-pay | Admitting: Oncology

## 2019-10-02 ENCOUNTER — Other Ambulatory Visit: Payer: Self-pay

## 2019-10-02 DIAGNOSIS — D0512 Intraductal carcinoma in situ of left breast: Secondary | ICD-10-CM

## 2019-10-23 ENCOUNTER — Telehealth: Payer: Self-pay | Admitting: Emergency Medicine

## 2019-10-23 NOTE — Telephone Encounter (Signed)
Fax came in from unknown sender requesting medications refill that Dr Hampton Abbot never prescribed. No refills given and added a note that patient was to contact pain clinic or person that prescribed her medications.   Attempted to contact pt to make her aware, no answer unable to leave vm due to voicebox being full.

## 2019-12-09 ENCOUNTER — Other Ambulatory Visit: Payer: Medicaid Other

## 2019-12-18 ENCOUNTER — Ambulatory Visit: Payer: Self-pay | Admitting: Surgery

## 2020-01-23 ENCOUNTER — Encounter: Payer: Self-pay | Admitting: *Deleted

## 2020-11-18 ENCOUNTER — Encounter: Payer: Self-pay | Admitting: Surgery

## 2020-11-18 ENCOUNTER — Other Ambulatory Visit: Payer: Self-pay

## 2020-11-18 ENCOUNTER — Ambulatory Visit: Payer: Self-pay | Admitting: Surgery

## 2020-11-18 VITALS — BP 129/86 | HR 78 | Temp 97.9°F | Ht 62.0 in | Wt 139.4 lb

## 2020-11-18 DIAGNOSIS — Z1231 Encounter for screening mammogram for malignant neoplasm of breast: Secondary | ICD-10-CM

## 2020-11-18 DIAGNOSIS — N644 Mastodynia: Secondary | ICD-10-CM

## 2020-11-18 DIAGNOSIS — D0512 Intraductal carcinoma in situ of left breast: Secondary | ICD-10-CM

## 2020-11-18 NOTE — Progress Notes (Signed)
11/18/2020  History of Present Illness: Sherry Short is a 45 y.o. female s/p left breast lumpectomy for DCIS in 12/2016 with Dr. Adonis Huguenin, with subsequent left breast excisional biopsy in 05/2017 for fibrodenomatoid change.  She had post-op radiation and had been on Tamoxifen, followed by Dr. Grayland Ormond.  She was last seen on 12/21/2018 and her mammogram back then had been normal with an oil cyst noted in the left breast.    Unfortunately, she also developed significant post-op and post-radiation pain in the left breast.  She was referred to the pain clinic but no appointment was made.  She also followed at [facility] for a 2nd opinion with regards to her pain, and confirmed that this was related to radiation and scarring affecting the nerves.    She also more recently was incarcerated for the past 6 months.  She was lost to follow up and while incarcerated, she stopped her Tamoxifen.  The doctor at the facility gave her Mobic for pain control but she says it has not really worked well.  She also feels her pain is spreading more, with pain in the entire left breast, extending somewhat towards her back and left upper arm.  She's also now starting to experience right breast pain. Denies any palpable masses, skin changes, or nipple drainage/changes.  She has not had a mammogram since 12/06/2018.  Past Medical History: Past Medical History:  Diagnosis Date   Anginal pain ([place]) /   chest pain probably related to anxiety, per pt.   Anxiety    Cancer ([place]) 12/13/2016   Left Breast (DCIS)   Migraine headache 08/2017   tylenol and pain medicine helps this   Personal history of radiation therapy 2018   left breast DCIS   Seasonal allergies      Past Surgical History: Past Surgical History:  Procedure Laterality Date   BREAST BIOPSY Left 11/30/2016   left breast calcs LIQ high grade DCIS.  AND [place] bx 1:00 10 cmfn fibroadenoma and phyllodes tumor   BREAST BIOPSY Left 12/13/2016   Left  Affrim Bx central calcs,  Ususal ductal hyperplasia   BREAST BIOPSY Left 12/13/2016   Left Affirm Bx- LIQ calcs, columnar cell change, disconcordant and excision recommened.    BREAST BIOPSY Left 06/01/2017   Left Affirm- of LIQ calcs, re bx of area biopsied in 12/2016 that was disconcordant, epithelial changes, CALCIFICATION ASSOCIATED WITH RADIATION EFFECT and fibroadenomatoid.    BREAST LUMPECTOMY Left 01/02/2017   DCIS and fibroadenoma with phyllodes excisied. SN bx, LN negative   BREAST LUMPECTOMY WITH SENTINEL LYMPH NODE BIOPSY Left 01/02/2017   Procedure: BREAST LUMPECTOMY WITH SENTINEL LYMPH NODE BX;  Surgeon: Clayburn Pert, MD;  Location: [facility] ORS;  Service: General;  Laterality: Left;   LAPAROSCOPIC OVARIAN CYSTECTOMY Left 09/18/2017   Procedure: LAPAROSCOPIC OVARIAN CYSTECTOMY (x 2);  Surgeon: Schermerhorn, Gwen Her, MD;  Location: [facility] ORS;  Service: Gynecology;  Laterality: Left;   LAPAROSCOPIC VAGINAL HYSTERECTOMY WITH SALPINGECTOMY Bilateral 09/18/2017   Procedure: LAPAROSCOPIC ASSISTED VAGINAL HYSTERECTOMY WITH SALPINGECTOMY;  Surgeon: Schermerhorn, Gwen Her, MD;  Location: [facility] ORS;  Service: Gynecology;  Laterality: Bilateral;   TUBAL LIGATION      Home Medications: Prior to Admission medications   Not on File    Allergies: No Known Allergies  Review of Systems: Review of Systems  Constitutional:  Negative for chills and fever.  Respiratory:  Negative for shortness of breath.   Cardiovascular:  Negative for chest pain.  Gastrointestinal:  Negative  for abdominal pain, nausea and vomiting.  Skin:        Left breast pain extending to her left upper arm and towards her back.   Physical Exam BP 129/86   Pulse 78   Temp 97.9 F (36.6 C) (Oral)   Ht '5\' 2"'$  (1.575 m)   Wt 139 lb 6.4 oz (63.2 kg)   LMP 09/04/2017 (Exact Date)   SpO2 98%   BMI 25.50 kg/m  CONSTITUTIONAL: No acute distress, well nourished. HEENT:  Normocephalic, atraumatic, extraocular motion  intact. RESPIRATORY:  Normal respiratory effort without pathologic use of accessory muscles. CARDIOVASCULAR:  Regular rhythm and rate. BREAST:  Left breast exam reveals two incisions that are well healed, in medial periareolar and upper outer quadrant.  There are no palpable masses, but the patient has diffuse tenderness throughout the left breast.  No nipple discharge/changes, no skin changes.  No left axillary lymphadenopathy, but the area is also tender to palpation going through the axilla and towards the left upper arm.  No palpable masses.  Right breast exam reveals no palpable masses, skin changes, or nipple changes.  However, she's also tender towards the medial aspect of the breast and some tenderness in retroareolar area, without any palpable masses or nipple drainage.  No right axillary lymphadenopathy. MUSCULOSKELETAL:  Normal gait, no peripheral edema. NEUROLOGIC:  Motor and sensation is grossly normal.  Cranial nerves are grossly intact. PSYCH:  Alert and oriented to person, place and time. Affect is normal.  Labs/Imaging: None recently  Assessment and Plan: This is a 45 y.o. female s/p left breast lumpectomy for DCIS and post-op / post-radiation left breast  pain.  --From her last visit in 2020, I think the pain is somewhat more widespread, although there are no new findings on breast exam such as masses or skin/nipple changes.  There is now some new tenderness in right breast, although there was no radiation or any surgeries on the right side.   --She's two years out from her last mammogram.  Will order bilateral mammogram to get back on track and evaluate for any possible source of pain on the right breast.  She's also stopped her Tamoxifen while she was incarcerated, so will send new referral to Dr. Grayland Ormond so she can get back on track with the prescription and follow ups.  Will also contact the pain clinic for new referral so they can work with the patient to help with her pain  control. --I will call the patient back with mammogram results. --If mammogram negative, then follow up in 1 year with new repeat mammogram.  Face-to-face time spent with the patient and care providers was 25 minutes, with more than 50% of the time spent counseling, educating, and coordinating care of the patient.     Melvyn Neth, [facility]

## 2020-11-18 NOTE — Patient Instructions (Addendum)
Urgent referral placed to pain clinic. There contact number [phone].  We have spoken with the pain clinic and the provider there is reviewing your medical information and will reach out to you soon.   We sent referral to Dr.Finnegan's office -someone from their office will contact you to schedule an appointment.       Mammogram scheduled 11/30/20 @ 10:40 am @ [facility]. Dr.Piscoya will call you with the results.   We will notify you August 2023 to schedule one year mammogram and follow up appointment with Dr.Piscoya.

## 2020-11-25 ENCOUNTER — Telehealth: Payer: Self-pay

## 2020-11-25 NOTE — Telephone Encounter (Signed)
Spoke with [place] @ pain clinic -make take several weeks for patient to get scheduled-pain clinic appointments are scheduling out to October and November due to only having 2 providers. [facility] [place] stated they would reach out to the patient once the physician has reviewed the medical information.

## 2020-11-30 ENCOUNTER — Other Ambulatory Visit: Payer: Self-pay

## 2020-11-30 ENCOUNTER — Ambulatory Visit
Admission: RE | Admit: 2020-11-30 | Discharge: 2020-11-30 | Disposition: A | Discharge status: Home / Self Care | Payer: Medicaid Other | Source: Ambulatory Visit | Attending: Surgery | Admitting: Surgery

## 2020-11-30 DIAGNOSIS — Z1231 Encounter for screening mammogram for malignant neoplasm of breast: Secondary | ICD-10-CM | POA: Insufficient documentation

## 2020-12-01 NOTE — Progress Notes (Signed)
12/01/20  Bilateral mammogram personally viewed and agree with the findings.  No evidence of malignancy or suspicious findings.  Result will be released to patient via MyChart.  Olean Ree, MD

## 2020-12-17 ENCOUNTER — Ambulatory Visit
Payer: Medicaid Other | Attending: Student in an Organized Health Care Education/Training Program | Admitting: Student in an Organized Health Care Education/Training Program

## 2020-12-17 ENCOUNTER — Other Ambulatory Visit: Payer: Self-pay

## 2020-12-17 ENCOUNTER — Encounter: Payer: Self-pay | Admitting: Student in an Organized Health Care Education/Training Program

## 2020-12-17 VITALS — BP 112/76 | HR 80 | Temp 97.7°F | Resp 16 | Ht 61.0 in | Wt 137.0 lb

## 2020-12-17 DIAGNOSIS — G8928 Other chronic postprocedural pain: Secondary | ICD-10-CM | POA: Insufficient documentation

## 2020-12-17 DIAGNOSIS — M792 Neuralgia and neuritis, unspecified: Secondary | ICD-10-CM | POA: Insufficient documentation

## 2020-12-17 DIAGNOSIS — G894 Chronic pain syndrome: Secondary | ICD-10-CM

## 2020-12-17 DIAGNOSIS — S248XXA Injury of other specified nerves of thorax, initial encounter: Secondary | ICD-10-CM | POA: Insufficient documentation

## 2020-12-17 DIAGNOSIS — R079 Chest pain, unspecified: Secondary | ICD-10-CM | POA: Insufficient documentation

## 2020-12-17 DIAGNOSIS — S248XXS Injury of other specified nerves of thorax, sequela: Secondary | ICD-10-CM | POA: Insufficient documentation

## 2020-12-17 MED ORDER — PREGABALIN 25 MG PO CAPS: ORAL_CAPSULE | ORAL | 0 refills | Status: DC

## 2020-12-17 NOTE — Progress Notes (Signed)
Safety precautions to be maintained throughout the outpatient stay will include: orient to surroundings, keep bed in low position, maintain call bell within reach at all times, provide assistance with transfer out of bed and ambulation.  

## 2020-12-17 NOTE — Progress Notes (Signed)
Patient: Sherry Short  Service Category: E/M  Provider: Gillis Santa, MD  DOB: [birth year]  DOS: 12/17/2020  Referring Provider: Olean Ree, MD  MRN: [id number]  Setting: Ambulatory outpatient  PCP: Medicine, [facility]  Type: New Patient  Specialty: Interventional Pain Management    Location: Office  Delivery: Face-to-face     Primary Reason(s) for Visit: Encounter for initial evaluation of one or more chronic problems (new to examiner) potentially causing chronic pain, and posing a threat to normal musculoskeletal function. (Level of risk: High) CC: Chest Pain (left)  HPI  Sherry Short is a 45 y.o. year old, female patient, who comes for the first time to our practice referred by Olean Ree, MD for our initial evaluation of her chronic pain. She has Ductal carcinoma in situ (DCIS) of left breast; Migraine; Seasonal allergies; Genetic testing; Left breast lump; Pelvic pain in female; Hyperlipidemia, unspecified; Intramural leiomyoma of uterus; Prediabetes; Left axillary pain; Breast pain, left; Postoperative state; Neuropathic pain; Injury of long thoracic nerve; Left-sided chest pain; Chronic pain syndrome; and Post-mastectomy pain syndrome on their problem list. Today she comes in for evaluation of her Chest Pain (left)  Pain Assessment: Location: Left Chest Radiating: left shoulder, left arm, left side of the back Onset: More than a month ago Duration: Chronic pain Quality: Other (Comment), Burning (severe, inflammatory) Severity: 10-Worst pain ever/10 (subjective, self-reported pain score)  Effect on ADL: difficulty performing daily activities, difficulty dressing Timing: Constant Modifying factors: nothing BP: 112/76  HR: 80  Onset and Duration: Gradual and Present longer than 3 months Cause of pain: Unknown Severity: Getting worse, NAS-11 at its worse: 10/10, NAS-11 at its best: 10/10, NAS-11 now: 10/10, and NAS-11 on the average: 10/10 Timing: Morning Aggravating Factors:  Lifiting, Motion, and Walking Alleviating Factors: Resting Associated Problems: Color changes, Depression, Fatigue, and Pain that does not allow patient to sleep Quality of Pain: Aching, Constant, and Exhausting Previous Examinations or Tests: Biopsy Previous Treatments: NSAIDs, gabapentin, intercostal nerve block  Sherry Short is a 45 year old female with a history of left breast lumpectomy for DCIS September 2018 with Dr. [place] Bells status post left breast excisional biopsy February 2019.  She is undergoing postop radiation and has been on tamoxifen.  Since her surgery and radiation, she has developed neuropathic pain of her left breast.  This is very painful.  Of note she was incarcerated for the last 6 months.  Majority of her pain is along her left breast, left axilla, left posterior thoracic region.  Of note she has tried gabapentin in the past, Mobic, acetaminophen with limited response.  She has had an intercostal nerve block done in the past, without any imaging guidance which she states was not effective.  Of note a Spanish interpreter was present at all times during the visit.  ROS  Cardiovascular: No reported cardiovascular signs or symptoms such as High blood pressure, coronary artery disease, abnormal heart rate or rhythm, heart attack, blood thinner therapy or heart weakness and/or failure Pulmonary or Respiratory: No reported pulmonary signs or symptoms such as wheezing and difficulty taking a deep full breath (Asthma), difficulty blowing air out (Emphysema), coughing up mucus (Bronchitis), persistent dry cough, or temporary stoppage of breathing during sleep Neurological: No reported neurological signs or symptoms such as seizures, abnormal skin sensations, urinary and/or fecal incontinence, being born with an abnormal open spine and/or a tethered spinal cord Psychological-Psychiatric: Anxiousness, Depressed, and Prone to panicking Gastrointestinal: No reported gastrointestinal signs or  symptoms such as vomiting or evacuating blood,  reflux, heartburn, alternating episodes of diarrhea and constipation, inflamed or scarred liver, or pancreas or irrregular and/or infrequent bowel movements Genitourinary: No reported renal or genitourinary signs or symptoms such as difficulty voiding or producing urine, peeing blood, non-functioning kidney, kidney stones, difficulty emptying the bladder, difficulty controlling the flow of urine, or chronic kidney disease Hematological: No reported hematological signs or symptoms such as prolonged bleeding, low or poor functioning platelets, bruising or bleeding easily, hereditary bleeding problems, low energy levels due to low hemoglobin or being anemic Endocrine: No reported endocrine signs or symptoms such as high or low blood sugar, rapid heart rate due to high thyroid levels, obesity or weight gain due to slow thyroid or thyroid disease Rheumatologic: No reported rheumatological signs and symptoms such as fatigue, joint pain, tenderness, swelling, redness, heat, stiffness, decreased range of motion, with or without associated rash Musculoskeletal: Negative for myasthenia gravis, muscular dystrophy, multiple sclerosis or malignant hyperthermia Work History: Unemployed  Allergies  Sherry Short has No Known Allergies.  Laboratory Chemistry Profile   Renal Lab Results  Component Value Date   BUN 10 09/19/2017   CREATININE 0.58 09/19/2017   GFRAA >60 09/19/2017   GFRNONAA >60 09/19/2017   PROTEINUR NEGATIVE 08/09/2015     Electrolytes Lab Results  Component Value Date   NA 137 09/19/2017   K 4.1 09/19/2017   CL 110 09/19/2017   CALCIUM 8.4 (L) 09/19/2017     Hepatic Lab Results  Component Value Date   AST 18 08/09/2015   ALT 13 (L) 08/09/2015   ALBUMIN 4.3 08/09/2015   ALKPHOS 68 08/09/2015   LIPASE 19 08/09/2015     ID Lab Results  Component Value Date   PREGTESTUR NEGATIVE 09/18/2017     Bone No results found for: [place],  VD125OH2TOT, [id number], ER7408XK4, 25OHVITD1, 25OHVITD2, 25OHVITD3, TESTOFREE, TESTOSTERONE   Endocrine Lab Results  Component Value Date   GLUCOSE 124 (H) 09/19/2017   GLUCOSEU NEGATIVE 08/09/2015     Neuropathy No results found for: VITAMINB12, FOLATE, HGBA1C, HIV   CNS No results found for: COLORCSF, APPEARCSF, RBCCOUNTCSF, WBCCSF, POLYSCSF, LYMPHSCSF, EOSCSF, PROTEINCSF, GLUCCSF, JCVIRUS, CSFOLI, IGGCSF, LABACHR, ACETBL, LABACHR, ACETBL   Inflammation (CRP: Acute  ESR: Chronic) No results found for: CRP, ESRSEDRATE, LATICACIDVEN   Rheumatology No results found for: RF, ANA, LABURIC, URICUR, LYMEIGGIGMAB, LYMEABIGMQN, HLAB27   Coagulation Lab Results  Component Value Date   PLT 210 09/19/2017     Cardiovascular Lab Results  Component Value Date   TROPONINI <0.03 08/09/2015   HGB 10.1 (L) 09/19/2017   HCT 29.5 (L) 09/19/2017     Screening Lab Results  Component Value Date   PREGTESTUR NEGATIVE 09/18/2017     Cancer No results found for: CEA, CA125, LABCA2   Allergens No results found for: ALMOND, APPLE, ASPARAGUS, AVOCADO, BANANA, BARLEY, BASIL, BAYLEAF, GREENBEAN, LIMABEAN, WHITEBEAN, BEEFIGE, REDBEET, BLUEBERRY, BROCCOLI, CABBAGE, MELON, CARROT, CASEIN, CASHEWNUT, CAULIFLOWER, CELERY     Note: Lab results reviewed.  PFSH  Drug: Sherry Short  reports no history of drug use. Alcohol:  reports no history of alcohol use. Tobacco:  reports that she has never smoked. She has never used smokeless tobacco. Medical:  has a past medical history of Anginal pain ([place]) (/), Anxiety, Cancer ([place]) (12/13/2016), Migraine headache (08/2017), Personal history of radiation therapy (2018), and Seasonal allergies. Family: family history includes Breast cancer (age of onset: 95) in her sister; Diabetes in her father; Healthy in her mother.  Past Surgical History:  Procedure Laterality Date  BREAST BIOPSY Left 11/30/2016   left breast calcs LIQ high grade DCIS.  AND [place] bx 1:00 10  cmfn fibroadenoma and phyllodes tumor   BREAST BIOPSY Left 12/13/2016   Left Affrim Bx central calcs,  Ususal ductal hyperplasia   BREAST BIOPSY Left 12/13/2016   Left Affirm Bx- LIQ calcs, columnar cell change, disconcordant and excision recommened.    BREAST BIOPSY Left 06/01/2017   Left Affirm- of LIQ calcs, re bx of area biopsied in 12/2016 that was disconcordant, epithelial changes, CALCIFICATION ASSOCIATED WITH RADIATION EFFECT and fibroadenomatoid.    BREAST LUMPECTOMY Left 01/02/2017   DCIS and fibroadenoma with phyllodes excisied. SN bx, LN negative   BREAST LUMPECTOMY WITH SENTINEL LYMPH NODE BIOPSY Left 01/02/2017   Procedure: BREAST LUMPECTOMY WITH SENTINEL LYMPH NODE BX;  Surgeon: Clayburn Pert, MD;  Location: [facility];  Service: General;  Laterality: Left;   LAPAROSCOPIC OVARIAN CYSTECTOMY Left 09/18/2017   Procedure: LAPAROSCOPIC OVARIAN CYSTECTOMY (x 2);  Surgeon: Schermerhorn, Gwen Her, MD;  Location: [facility];  Service: Gynecology;  Laterality: Left;   LAPAROSCOPIC VAGINAL HYSTERECTOMY WITH SALPINGECTOMY Bilateral 09/18/2017   Procedure: LAPAROSCOPIC ASSISTED VAGINAL HYSTERECTOMY WITH SALPINGECTOMY;  Surgeon: Schermerhorn, Gwen Her, MD;  Location: [facility];  Service: Gynecology;  Laterality: Bilateral;   TUBAL LIGATION     Active Ambulatory Problems    Diagnosis Date Noted   Ductal carcinoma in situ (DCIS) of left breast 12/14/2016   Migraine 12/16/2016   Seasonal allergies 12/16/2016   Genetic testing 04/17/2017   Left breast lump 05/08/2017   Pelvic pain in female 06/23/2017   Hyperlipidemia, unspecified 06/28/2017   Intramural leiomyoma of uterus 06/23/2017   Prediabetes 06/27/2017   Left axillary pain 06/28/2017   Breast pain, left 06/28/2017   Postoperative state 09/18/2017   Neuropathic pain 12/17/2020   Injury of long thoracic nerve 12/17/2020   Left-sided chest pain 12/17/2020   Chronic pain syndrome 12/17/2020   Post-mastectomy pain syndrome 12/17/2020    Resolved Ambulatory Problems    Diagnosis Date Noted   No Resolved Ambulatory Problems   Past Medical History:  Diagnosis Date   Anginal pain ([place]) /   Anxiety    Cancer ([place]) 12/13/2016   Migraine headache 08/2017   Personal history of radiation therapy 2018   Constitutional Exam  General appearance: Well nourished, well developed, and well hydrated. In no apparent acute distress Vitals:   12/17/20 1256  BP: 112/76  Pulse: 80  Resp: 16  Temp: 97.7 F (36.5 C)  TempSrc: Temporal  SpO2: 100%  Weight: 137 lb (62.1 kg)  Height: 5' 1"  (1.549 m)   BMI Assessment: Estimated body mass index is 25.89 kg/m as calculated from the following:   Height as of this encounter: 5' 1"  (1.549 m).   Weight as of this encounter: 137 lb (62.1 kg).  BMI interpretation table: BMI level Category Range association with higher incidence of chronic pain  <18 kg/m2 Underweight   18.5-24.9 kg/m2 Ideal body weight   25-29.9 kg/m2 Overweight Increased incidence by 20%  30-34.9 kg/m2 Obese (Class I) Increased incidence by 68%  35-39.9 kg/m2 Severe obesity (Class II) Increased incidence by 136%  >40 kg/m2 Extreme obesity (Class III) Increased incidence by 254%   Patient's current BMI Ideal Body weight  Body mass index is 25.89 kg/m. Ideal body weight: 47.8 kg (105 lb 6.1 oz) Adjusted ideal body weight: 53.5 kg (118 lb 0.4 oz)   BMI Readings from Last 4 Encounters:  12/17/20 25.89 kg/m  11/18/20 25.50 kg/m  12/21/18 24.41 kg/m  06/21/18 25.18 kg/m   Wt Readings from Last 4 Encounters:  12/17/20 137 lb (62.1 kg)  11/18/20 139 lb 6.4 oz (63.2 kg)  12/21/18 137 lb 12.8 oz (62.5 kg)  06/21/18 133 lb 4 oz (60.4 kg)    Psych/Mental status: Alert, oriented x 3 (person, place, & time)       Eyes: PERLA Respiratory: No evidence of acute respiratory distress  Pain along left lateral breast, left axilla, left posterior lateral thoracic region. Pain with left shoulder abduction  5 out of 5  strength bilateral upper extremity: Shoulder abduction, elbow flexion, elbow extension, thumb extension. 5 out of 5 strength bilateral lower extremity: Plantar flexion, dorsiflexion, knee flexion, knee extension.   Assessment  Primary Diagnosis & Pertinent Problem List: The primary encounter diagnosis was Post-mastectomy pain syndrome. Diagnoses of Neuropathic pain, Injury of long thoracic nerve, sequela, Left-sided chest pain, and Chronic pain syndrome were also pertinent to this visit.  Visit Diagnosis (New problems to examiner): 1. Post-mastectomy pain syndrome   2. Neuropathic pain   3. Injury of long thoracic nerve, sequela   4. Left-sided chest pain   5. Chronic pain syndrome    Plan of Care (Initial workup plan)    Sherry Short is likely dealing with postmastectomy pain syndrome given her pain distribution along her left outer breast with radiation to her axilla and along her bicep. Recommend Lyrica titration as below.  She did not respond to gabapentin. She has tried an intercostal nerve block with Dr. Hampton Abbot however she states that this was done blind, without any imaging guidance If medication management is not effective, we will consider left PECS block for postmastectomy pain syndrome.   Pharmacotherapy (current): Medications ordered:  Meds ordered this encounter  Medications   pregabalin (LYRICA) 25 MG capsule    Sig: Take 1 capsule (25 mg total) by mouth at bedtime for 20 days, THEN 2 capsules (50 mg total) at bedtime for 20 days, THEN 3 capsules (75 mg total) at bedtime for 20 days.    Dispense:  120 capsule    Refill:  0    Fill one day early if pharmacy is closed on scheduled refill date. May substitute for generic if available.   Medications administered during this visit: Sherry Short had no medications administered during this visit.    Provider-requested follow-up: Return in about 8 weeks (around 02/11/2021) for Medication Management, in person.  Future  Appointments  Date Time Provider [place]  12/24/2020 10:30 AM Lloyd Huger, MD [facility] None  02/09/2021  9:00 AM Gillis Santa, MD [facility] None   I spent a total of 60 minutes reviewing chart data, face-to-face evaluation with the patient, counseling and coordination of care as detailed above.   Note by: Gillis Santa, MD Date: 12/17/2020; Time: 2:36 PM

## 2020-12-19 NOTE — Progress Notes (Signed)
[facility]  [phone] [phone]) [phone]  ID: Sherry Short OB: Feb 23, 1976  MR#: [id number]  YFV#:[id number]  Patient Care Team: Medicine, [facility] as PCP - General  CHIEF COMPLAINT: Left breast DCIS.  INTERVAL HISTORY: Patient was last evaluated in clinic in March 2020.  She returns today to reestablish care and reinitiate tamoxifen.  She continues to have left-sided shoulder and back pain that is actively being evaluated and treated in pain clinic.  She otherwise feels well.  She has no neurologic complaints. She denies any recent fevers or illnesses. She has a good appetite and denies weight loss. She denies any other pain. She has no chest pain or shortness of breath. She denies any nausea, vomiting, constipation, or diarrhea. She has no urinary complaints.  Patient offers no further specific complaints today.  REVIEW OF SYSTEMS:   Review of Systems  Constitutional: Negative.  Negative for fever, malaise/fatigue and weight loss.  Respiratory: Negative.  Negative for cough and shortness of breath.   Cardiovascular: Negative.  Negative for chest pain and leg swelling.  Gastrointestinal: Negative.  Negative for abdominal pain.  Genitourinary:  Positive for flank pain. Negative for dysuria.  Musculoskeletal:  Positive for back pain and joint pain.  Skin: Negative.  Negative for rash.  Neurological: Negative.  Negative for dizziness, sensory change, weakness and headaches.  Psychiatric/Behavioral: Negative.  The patient is not nervous/anxious.    As per HPI. Otherwise, a complete review of systems is negative.  PAST MEDICAL HISTORY: Past Medical History:  Diagnosis Date   Anginal pain ([place]) /   chest pain probably related to anxiety, per pt.   Anxiety    Cancer ([place]) 12/13/2016   Left Breast (DCIS)   Migraine headache 08/2017   tylenol and pain medicine helps this   Personal history of radiation therapy 2018   left breast DCIS    Seasonal allergies     PAST SURGICAL HISTORY: Past Surgical History:  Procedure Laterality Date   BREAST BIOPSY Left 11/30/2016   left breast calcs LIQ high grade DCIS.  AND [place] bx 1:00 10 cmfn fibroadenoma and phyllodes tumor   BREAST BIOPSY Left 12/13/2016   Left Affrim Bx central calcs,  Ususal ductal hyperplasia   BREAST BIOPSY Left 12/13/2016   Left Affirm Bx- LIQ calcs, columnar cell change, disconcordant and excision recommened.    BREAST BIOPSY Left 06/01/2017   Left Affirm- of LIQ calcs, re bx of area biopsied in 12/2016 that was disconcordant, epithelial changes, CALCIFICATION ASSOCIATED WITH RADIATION EFFECT and fibroadenomatoid.    BREAST LUMPECTOMY Left 01/02/2017   DCIS and fibroadenoma with phyllodes excisied. SN bx, LN negative   BREAST LUMPECTOMY WITH SENTINEL LYMPH NODE BIOPSY Left 01/02/2017   Procedure: BREAST LUMPECTOMY WITH SENTINEL LYMPH NODE BX;  Surgeon: Clayburn Pert, MD;  Location: [facility];  Service: General;  Laterality: Left;   LAPAROSCOPIC OVARIAN CYSTECTOMY Left 09/18/2017   Procedure: LAPAROSCOPIC OVARIAN CYSTECTOMY (x 2);  Surgeon: Schermerhorn, Gwen Her, MD;  Location: [facility];  Service: Gynecology;  Laterality: Left;   LAPAROSCOPIC VAGINAL HYSTERECTOMY WITH SALPINGECTOMY Bilateral 09/18/2017   Procedure: LAPAROSCOPIC ASSISTED VAGINAL HYSTERECTOMY WITH SALPINGECTOMY;  Surgeon: Schermerhorn, Gwen Her, MD;  Location: [facility];  Service: Gynecology;  Laterality: Bilateral;   TUBAL LIGATION      FAMILY HISTORY: Family History  Problem Relation Age of Onset   Breast cancer Sister 96   Healthy Mother    Diabetes Father     ADVANCED DIRECTIVES (Y/N):  N  HEALTH MAINTENANCE: Social History   Tobacco Use   Smoking status: Never   Smokeless tobacco: Never  Vaping Use   Vaping Use: Never used  Substance Use Topics   Alcohol use: No   Drug use: No     Colonoscopy:  PAP:  Bone density:  Lipid panel:  No Known Allergies  Current Outpatient  Medications  Medication Sig Dispense Refill   pregabalin (LYRICA) 25 MG capsule Take 1 capsule (25 mg total) by mouth at bedtime for 20 days, THEN 2 capsules (50 mg total) at bedtime for 20 days, THEN 3 capsules (75 mg total) at bedtime for 20 days. 120 capsule 0   tamoxifen (NOLVADEX) 20 MG tablet Take 1 tablet (20 mg total) by mouth daily. 90 tablet 3   No current facility-administered medications for this visit.    OBJECTIVE: Vitals:   12/24/20 1041  BP: 119/82  Pulse: 75  Resp: 16  Temp: 97.9 F (36.6 C)  SpO2: 99%     Body mass index is 26.79 kg/m.    ECOG FS:0 - Asymptomatic  General: Well-developed, well-nourished, no acute distress. Eyes: Pink conjunctiva, anicteric sclera. HEENT: Normocephalic, moist mucous membranes. Breasts: Exam deferred today. Lungs: No audible wheezing or coughing. Heart: Regular rate and rhythm. Abdomen: Soft, nontender, no obvious distention. Musculoskeletal: No edema, cyanosis, or clubbing. Neuro: Alert, answering all questions appropriately. Cranial nerves grossly intact. Skin: No rashes or petechiae noted. Psych: Normal affect.   LAB RESULTS:  Lab Results  Component Value Date   NA 137 09/19/2017   K 4.1 09/19/2017   CL 110 09/19/2017   CO2 20 (L) 09/19/2017   GLUCOSE 124 (H) 09/19/2017   BUN 10 09/19/2017   CREATININE 0.58 09/19/2017   CALCIUM 8.4 (L) 09/19/2017   PROT 7.5 08/09/2015   ALBUMIN 4.3 08/09/2015   AST 18 08/09/2015   ALT 13 (L) 08/09/2015   ALKPHOS 68 08/09/2015   BILITOT 0.7 08/09/2015   GFRNONAA >60 09/19/2017   GFRAA >60 09/19/2017    Lab Results  Component Value Date   WBC 11.6 (H) 09/19/2017   HGB 10.1 (L) 09/19/2017   HCT 29.5 (L) 09/19/2017   MCV 88.0 09/19/2017   PLT 210 09/19/2017     STUDIES: MM 3D SCREEN BREAST BILATERAL  Result Date: 12/01/2020 CLINICAL DATA:  Screening. EXAM: DIGITAL SCREENING BILATERAL MAMMOGRAM WITH TOMOSYNTHESIS AND CAD TECHNIQUE: Bilateral screening digital  craniocaudal and mediolateral oblique mammograms were obtained. Bilateral screening digital breast tomosynthesis was performed. The images were evaluated with computer-aided detection. COMPARISON:  Previous exam(s). ACR Breast Density Category c: The breast tissue is heterogeneously dense, which may obscure small masses. FINDINGS: There are no findings suspicious for malignancy. IMPRESSION: No mammographic evidence of malignancy. A result letter of this screening mammogram will be mailed directly to the patient. RECOMMENDATION: Screening mammogram in one year. (Code:SM-B-01Y) BI-RADS CATEGORY  1: Negative. Electronically Signed   By: Kristopher Oppenheim M.D.   On: 12/01/2020 14:26   ASSESSMENT: Left breast DCIS.  PLAN:    1. Left breast DCIS: Patient underwent lumpectomy on January 02, 2017. Final pathology did not reveal an invasive component.  Patient completed adjuvant XRT on March 21, 2017.  Her most recent mammogram on December 01, 2020 was reported BI-RADS 1.  Repeat in August 23.  Patient initially was to complete tamoxifen in December 2023, but since she has not taken any treatment for over a year, have recommended continuing treatment through December 2024.  Patient was given a refill for  tamoxifen today.  Return to clinic in 6 months for routine evaluation.   2.  Family history: Patient reports her sister had breast cancer at the age of 51 and was treated at [facility]. By report, her genetic testing was negative.  Patient's genetic testing is negative as well. 3.  Shoulder/torso pain: Continue treatment per pain clinic.  I spent a total of 20 minutes reviewing chart data, face-to-face evaluation with the patient, counseling and coordination of care as detailed above.   The entire visit was done in the presence of an interpreter.  Patient expressed understanding and was in agreement with this plan. She also understands that She can call clinic at any time with any questions, concerns, or complaints.    Cancer Staging Ductal carcinoma in situ (DCIS) of left breast Staging form: Breast, AJCC 8th Edition - Clinical stage from 12/15/2016: Stage 0 (cTis (DCIS), cN0, cM0, ER+, PR+, HER2-) - Signed by Lloyd Huger, MD on 12/15/2016 Nuclear grade: G2 - Pathologic stage from 01/09/2017: Stage 0 (pTis (DCIS), pN0, cM0, ER+, PR+, HER2-) - Signed by Lloyd Huger, MD on 01/09/2017 Neoadjuvant therapy: No Nuclear grade: G2 Laterality: Left   Lloyd Huger, MD   12/25/2020 11:45 AM

## 2020-12-24 ENCOUNTER — Inpatient Hospital Stay: Payer: Medicaid Other | Attending: Oncology | Admitting: Oncology

## 2020-12-24 VITALS — BP 119/82 | HR 75 | Temp 97.9°F | Resp 16 | Wt 141.8 lb

## 2020-12-24 DIAGNOSIS — Z9079 Acquired absence of other genital organ(s): Secondary | ICD-10-CM | POA: Insufficient documentation

## 2020-12-24 DIAGNOSIS — Z86018 Personal history of other benign neoplasm: Secondary | ICD-10-CM | POA: Insufficient documentation

## 2020-12-24 DIAGNOSIS — M549 Dorsalgia, unspecified: Secondary | ICD-10-CM | POA: Insufficient documentation

## 2020-12-24 DIAGNOSIS — Z833 Family history of diabetes mellitus: Secondary | ICD-10-CM | POA: Insufficient documentation

## 2020-12-24 DIAGNOSIS — Z79899 Other long term (current) drug therapy: Secondary | ICD-10-CM | POA: Insufficient documentation

## 2020-12-24 DIAGNOSIS — M25512 Pain in left shoulder: Secondary | ICD-10-CM | POA: Insufficient documentation

## 2020-12-24 DIAGNOSIS — D0512 Intraductal carcinoma in situ of left breast: Secondary | ICD-10-CM | POA: Insufficient documentation

## 2020-12-24 DIAGNOSIS — Z803 Family history of malignant neoplasm of breast: Secondary | ICD-10-CM | POA: Insufficient documentation

## 2020-12-24 DIAGNOSIS — Z923 Personal history of irradiation: Secondary | ICD-10-CM | POA: Insufficient documentation

## 2020-12-24 DIAGNOSIS — R109 Unspecified abdominal pain: Secondary | ICD-10-CM | POA: Insufficient documentation

## 2020-12-24 MED ORDER — TAMOXIFEN CITRATE 20 MG PO TABS: 20.0000 mg | ORAL_TABLET | Freq: Every day | ORAL | 3 refills | Status: DC

## 2020-12-24 NOTE — Progress Notes (Signed)
Pt here to re-establish care. With assistance from interpreter, pt endorses left sided breast/chest/back pain with no relief from tylenol or ibuprofen. Pt states she stopped taking tamoxifen a year ago. No other concerns at this time.

## 2020-12-28 ENCOUNTER — Encounter: Payer: Self-pay | Admitting: Emergency Medicine

## 2020-12-28 NOTE — Progress Notes (Signed)
Tamoxifen prescription faxed to [facility] [phone] in [place].

## 2020-12-29 ENCOUNTER — Telehealth: Payer: Self-pay | Admitting: Student in an Organized Health Care Education/Training Program

## 2020-12-29 DIAGNOSIS — G8928 Other chronic postprocedural pain: Secondary | ICD-10-CM

## 2020-12-29 DIAGNOSIS — G894 Chronic pain syndrome: Secondary | ICD-10-CM

## 2020-12-29 DIAGNOSIS — M792 Neuralgia and neuritis, unspecified: Secondary | ICD-10-CM

## 2020-12-29 MED ORDER — DULOXETINE HCL 20 MG PO CPEP: ORAL_CAPSULE | ORAL | 0 refills | Status: DC

## 2020-12-29 NOTE — Telephone Encounter (Signed)
Lyrica prescribed 12-17-20

## 2020-12-29 NOTE — Addendum Note (Signed)
Addended by: Gillis Santa on: 12/29/2020 01:00 PM   Modules accepted: Orders

## 2020-12-29 NOTE — Telephone Encounter (Signed)
Computer system would not allow Dr. Holley Raring to e-scribe. Script is printed, in cabinet at nurse's station.  Attempted to call patient, voice mail not set up.

## 2020-12-29 NOTE — Telephone Encounter (Signed)
Patient contacted and instructed on how to take Cymbalta.

## 2021-02-09 ENCOUNTER — Encounter: Payer: Medicaid Other | Admitting: Student in an Organized Health Care Education/Training Program

## 2021-02-22 ENCOUNTER — Other Ambulatory Visit: Payer: Self-pay

## 2021-02-22 ENCOUNTER — Ambulatory Visit
Payer: Medicaid Other | Attending: Student in an Organized Health Care Education/Training Program | Admitting: Student in an Organized Health Care Education/Training Program

## 2021-02-22 ENCOUNTER — Encounter: Payer: Self-pay | Admitting: Student in an Organized Health Care Education/Training Program

## 2021-02-22 VITALS — BP 144/89 | HR 83 | Temp 97.2°F | Resp 16 | Ht 61.0 in | Wt 138.0 lb

## 2021-02-22 DIAGNOSIS — G8928 Other chronic postprocedural pain: Secondary | ICD-10-CM | POA: Insufficient documentation

## 2021-02-22 DIAGNOSIS — M792 Neuralgia and neuritis, unspecified: Secondary | ICD-10-CM | POA: Insufficient documentation

## 2021-02-22 DIAGNOSIS — S248XXS Injury of other specified nerves of thorax, sequela: Secondary | ICD-10-CM | POA: Insufficient documentation

## 2021-02-22 DIAGNOSIS — G894 Chronic pain syndrome: Secondary | ICD-10-CM | POA: Insufficient documentation

## 2021-02-22 MED ORDER — NONFORMULARY OR COMPOUNDED ITEM: 2 refills | Status: AC

## 2021-02-22 MED ORDER — GABAPENTIN 300 MG PO CAPS: ORAL_CAPSULE | ORAL | 0 refills | Status: DC

## 2021-02-22 NOTE — Progress Notes (Signed)
PROVIDER NOTE: Information contained herein reflects review and annotations entered in association with encounter. Interpretation of such information and data should be left to medically-trained personnel. Information provided to patient can be located elsewhere in the medical record under "Patient Instructions". Document created using STT-dictation technology, any transcriptional errors that may result from process are unintentional.    Patient: Sherry Short  Service Category: E/M  Provider: Gillis Santa, MD  DOB: [birth year]  DOS: 02/22/2021  Specialty: Interventional Pain Management  MRN: [id number]  Setting: Ambulatory outpatient  PCP: Medicine, Sherry Short Family  Type: Established Patient    Referring Provider: Medicine, Sherry Short*  Location: Office  Delivery: Face-to-face     HPI  [name], a 45 y.o. 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 Sherry Short primary complain today is Back Pain (Thoracic right side radiating around to the front and also up to the shoulder on the right. ) Last encounter: My last encounter with her was on 12/17/20 Pain Assessment: Severity of Chronic pain is reported as a 10-Worst pain ever/10. Location: Back Mid, Right/around to rib cage on the right and up to right shoulder. Onset: More than a month ago. Quality: Discomfort, Constant, Other (Comment), Burning, Sharp (weakness, itching). Timing: Constant. Modifying factor(s): nothign currently. Vitals:  height is 5' 1"  (1.549 m) and weight is 138  lb (62.6 kg). Her temporal temperature is 97.2 F (36.2 C) (abnormal). Her blood pressure is 144/89 (abnormal) and her pulse is 83. Her respiration is 16 and oxygen saturation is 100%.   Reason for encounter:   Patient presents for second visit today.  Unfortunately no benefit with Cymbalta.  I have provided her instructions to how to reduce/discontinue.  She was also started on Lyrica but that caused side effects of nausea and sedation.  We will retrial gabapentin as below.  Also recommend compounded neuropathic cream that she can apply to her left breast and chest region.   HPI from initial clinic visit 12/17/2020: Sherry Short is a 45 year old female with a history of left breast lumpectomy for DCIS September 2018 with Dr. Lake Short status post left breast excisional biopsy February 2019.  She is undergoing postop radiation and has been on tamoxifen.  Since her surgery and radiation, she has developed neuropathic pain of her left breast.  This is very painful.  Of note she was incarcerated for the last 6 months.  Majority of her pain is along her left breast, left axilla, left posterior thoracic region.  Of note she has tried gabapentin in the past, Mobic, acetaminophen with limited response.  She has had an intercostal nerve block done in the past, without any imaging guidance which she states was not effective.   Of note a Spanish interpreter was present at all times during the visit.  ROS  Constitutional: Denies any fever or chills Gastrointestinal: No reported hemesis, hematochezia, vomiting, or acute GI distress Musculoskeletal: Denies any acute onset joint swelling, redness, loss of ROM, or weakness Neurological:  Burning, tingling, pruritus of the left breast/chest wall  Medication Review  DULoxetine, NONFORMULARY OR COMPOUNDED ITEM, gabapentin, and tamoxifen  History Review  Allergy: Ms.  Short has No Known Allergies. Drug: Sherry Short  reports no history of drug use. Alcohol:  reports no  history of alcohol use. Tobacco:  reports that she has never smoked. She has never used smokeless tobacco. Social: Sherry Short  reports that she has never smoked. She has never used smokeless tobacco. She reports that she does not drink alcohol and does not use drugs. Medical:  has a past medical history of Anginal pain ([place]) (/), Anxiety, Cancer ([place]) (12/13/2016), Migraine headache (08/2017), Personal history of radiation therapy (2018), and Seasonal allergies. Surgical: Sherry Short  has a past surgical history that includes Tubal ligation; Breast lumpectomy with sentinel lymph node bx (Left, 01/02/2017); Laparoscopic vaginal hysterectomy with salpingectomy (Bilateral, 09/18/2017); Laparoscopic ovarian cystectomy (Left, 09/18/2017); Breast lumpectomy (Left, 01/02/2017); Breast biopsy (Left, 11/30/2016); Breast biopsy (Left, 12/13/2016); Breast biopsy (Left, 12/13/2016); and Breast biopsy (Left, 06/01/2017). Family: family history includes Breast cancer (age of onset: 39) in her sister; Diabetes in her father; Healthy in her mother.  Laboratory Chemistry Profile   Renal Lab Results  Component Value Date   BUN 10 09/19/2017   CREATININE 0.58 09/19/2017   GFRAA >60 09/19/2017   GFRNONAA >60 09/19/2017    Hepatic Lab Results  Component Value Date   AST 18 08/09/2015   ALT 13 (L) 08/09/2015   ALBUMIN 4.3 08/09/2015   ALKPHOS 68 08/09/2015   LIPASE 19 08/09/2015    Electrolytes Lab Results  Component Value Date   NA 137 09/19/2017   K 4.1 09/19/2017   CL 110 09/19/2017   CALCIUM 8.4 (L) 09/19/2017    Bone No results found for: VD25OH, VD125OH2TOT, ZM6294TM5, YY5035WS5, 25OHVITD1, 25OHVITD2, 25OHVITD3, TESTOFREE, TESTOSTERONE  Inflammation (CRP: Acute Phase) (ESR: Chronic Phase) No results found for: CRP, ESRSEDRATE, LATICACIDVEN       Note: Above Lab results reviewed.  Recent Imaging Review  MM 3D SCREEN BREAST BILATERAL CLINICAL DATA:  Screening.  EXAM: DIGITAL SCREENING BILATERAL  MAMMOGRAM WITH TOMOSYNTHESIS AND CAD  TECHNIQUE: Bilateral screening digital craniocaudal and mediolateral oblique mammograms were obtained. Bilateral screening digital breast tomosynthesis was performed. The images were evaluated with computer-aided detection.  COMPARISON:  Previous exam(s).  ACR Breast Density Category c: The breast tissue is heterogeneously dense, which may obscure small masses.  FINDINGS: There are no findings suspicious for malignancy.  IMPRESSION: No mammographic evidence of malignancy. A result letter of this screening mammogram will be mailed directly to the patient.  RECOMMENDATION: Screening mammogram in one year. (Code:SM-B-01Y)  BI-RADS CATEGORY  1: Negative.  Electronically Signed   By: Kristopher Oppenheim M.D.   On: 12/01/2020 14:26 Note: Reviewed        Physical Exam  General appearance: Well nourished, well developed, and well hydrated. In no apparent acute distress Mental status: Alert, oriented x 3 (person, place, & time)       Respiratory: No evidence of acute respiratory distress Eyes: PERLA Vitals: BP (!) 144/89 (BP Location: Left Arm, Patient Position: Sitting, Cuff Size: Normal)   Pulse 83   Temp (!) 97.2 F (36.2 C) (Temporal)   Resp 16   Ht 5' 1"  (1.549 m)   Wt 138 lb (62.6 kg)   LMP 09/04/2017 (Exact Date)   SpO2 100%   BMI 26.07 kg/m  BMI: Estimated body mass index is 26.07 kg/m as calculated from the following:   Height as of this encounter: 5' 1"  (1.549 m).   Weight as of this encounter: 138 lb (62.6 kg). Ideal: Ideal body weight: 47.8 kg (105  lb 6.1 oz) Adjusted ideal body weight: 53.7 kg (118 lb 6.8 oz)  Pain along left lateral breast, left axilla, left posterior lateral thoracic region. Pain with left shoulder abduction   5 out of 5 strength bilateral upper extremity: Shoulder abduction, elbow flexion, elbow extension, thumb extension. 5 out of 5 strength bilateral lower extremity: Plantar flexion, dorsiflexion, knee  flexion, knee extension.  Assessment   Status Diagnosis  Persistent Persistent Persistent 1. Post-mastectomy pain syndrome   2. Neuropathic pain   3. Chronic pain syndrome   4. Injury of long thoracic nerve, sequela       Plan of Care    Ms. Kailyn Vanderslice Covino has a current medication list which includes the following long-term medication(s): gabapentin and duloxetine.  Pharmacotherapy (Medications Ordered): Meds ordered this encounter  Medications   gabapentin (NEURONTIN) 300 MG capsule    Sig: Take 1 capsule (300 mg total) by mouth at bedtime for 15 days, THEN 1 capsule (300 mg total) 2 (two) times daily.    Dispense:  105 capsule    Refill:  0   NONFORMULARY OR COMPOUNDED ITEM    Sig: Sig: Apply 1-2 gm(s) (2-4 pumps) to affected area, 3-4 times/day. (1 pump = 0.5 gm)    Dispense:  1 each    Refill:  2    Compounded cream: 6% Gabapentin, 2.5% Lidocaine, 0.5% Meloxicam, 5% Methocarbamol, 2.5% Prilocaine. Dispense: 120 gm Pump Bottle. (Dispenser: 1 pump = 0.5 gm.)    Follow-up plan:   Return in about 10 weeks (around 05/03/2021) for Medication Management, in person.    Recent Visits Date Type Provider Dept  12/17/20 Office Visit Sherry Santa, MD [facility]  Showing recent visits within past 90 days and meeting all other requirements Today's Visits Date Type Provider Dept  02/22/21 Office Visit Sherry Santa, MD [facility]  Showing today's visits and meeting all other requirements Future Appointments Date Type Provider Dept  04/29/21 Appointment Sherry Santa, MD [facility]  Showing future appointments within next 90 days and meeting all other requirements I discussed the assessment and treatment plan with the patient. The patient was provided an opportunity to ask questions and all were answered. The patient agreed with the plan and demonstrated an understanding of the instructions.  Patient advised to call back or seek an in-person  evaluation if the symptoms or condition worsens.  Duration of encounter: 2mnutes.  Note by: BGillis Santa MD Date: 02/22/2021; Time: 2:58 PM

## 2021-02-22 NOTE — Progress Notes (Signed)
Safety precautions to be maintained throughout the outpatient stay will include: orient to surroundings, keep bed in low position, maintain call bell within reach at all times, provide assistance with transfer out of bed and ambulation.  

## 2021-02-22 NOTE — Patient Instructions (Signed)
1.  Decrease Cymbalta to 20 mg for the next week and then discontinue thereafter 2.  Start gabapentin 300 mg before bedtime, increase to 300 mg 3 times daily after 2 weeks no side effects 3.  Prescription for a compounded cream to be applied to left breast and chest region for neuropathic pain.  Please do not fill if this is expensive.

## 2021-02-23 ENCOUNTER — Telehealth: Payer: Self-pay | Admitting: *Deleted

## 2021-02-23 NOTE — Telephone Encounter (Signed)
Called patient back to give her prices of compounded cream.  120 g = $95 and 60 g = $55.  Patient states that she may just get half of the mixture.  I did tell her to go ahead and let them run through insurance as there is a chance that they would cover and it wouldn't hurt to try.  Patient verbalizes u/o information.

## 2021-04-29 ENCOUNTER — Other Ambulatory Visit: Payer: Self-pay

## 2021-04-29 ENCOUNTER — Ambulatory Visit
Payer: Self-pay | Attending: Student in an Organized Health Care Education/Training Program | Admitting: Student in an Organized Health Care Education/Training Program

## 2021-04-29 ENCOUNTER — Encounter: Payer: Self-pay | Admitting: Student in an Organized Health Care Education/Training Program

## 2021-04-29 VITALS — BP 115/79 | HR 87 | Temp 97.1°F | Ht 62.0 in | Wt 145.0 lb

## 2021-04-29 DIAGNOSIS — M25512 Pain in left shoulder: Secondary | ICD-10-CM | POA: Insufficient documentation

## 2021-04-29 DIAGNOSIS — G894 Chronic pain syndrome: Secondary | ICD-10-CM | POA: Insufficient documentation

## 2021-04-29 DIAGNOSIS — G8929 Other chronic pain: Secondary | ICD-10-CM | POA: Insufficient documentation

## 2021-04-29 DIAGNOSIS — M542 Cervicalgia: Secondary | ICD-10-CM | POA: Insufficient documentation

## 2021-04-29 DIAGNOSIS — G8928 Other chronic postprocedural pain: Secondary | ICD-10-CM | POA: Insufficient documentation

## 2021-04-29 DIAGNOSIS — R079 Chest pain, unspecified: Secondary | ICD-10-CM | POA: Insufficient documentation

## 2021-04-29 DIAGNOSIS — G5682 Other specified mononeuropathies of left upper limb: Secondary | ICD-10-CM | POA: Insufficient documentation

## 2021-04-29 DIAGNOSIS — S248XXS Injury of other specified nerves of thorax, sequela: Secondary | ICD-10-CM | POA: Insufficient documentation

## 2021-04-29 DIAGNOSIS — M792 Neuralgia and neuritis, unspecified: Secondary | ICD-10-CM | POA: Insufficient documentation

## 2021-04-29 MED ORDER — AMITRIPTYLINE HCL 25 MG PO TABS: 25.0000 mg | ORAL_TABLET | Freq: Every day | ORAL | 2 refills | Status: DC

## 2021-04-29 NOTE — Progress Notes (Signed)
Safety precautions to be maintained throughout the outpatient stay will include: orient to surroundings, keep bed in low position, maintain call bell within reach at all times, provide assistance with transfer out of bed and ambulation.  

## 2021-04-29 NOTE — Progress Notes (Signed)
PROVIDER NOTE: Information contained herein reflects review and annotations entered in association with encounter. Interpretation of such information and data should be left to medically-trained personnel. Information provided to patient can be located elsewhere in the medical record under "Patient Instructions". Document created using STT-dictation technology, any transcriptional errors that may result from process are unintentional.    Patient: Sherry Short  Service Category: E/M  Provider: Gillis Santa, MD  DOB: [birth year]  DOS: 04/29/2021  Specialty: Interventional Pain Management  MRN: [id number]  Setting: Ambulatory outpatient  PCP: Medicine, [facility]  Type: Established Patient    Referring Provider: Medicine, Arletha Pili*  Location: Office  Delivery: Face-to-face     HPI  Sherry Short, a 46 y.o. 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 [name] primary complain today is Shoulder Pain  Last encounter: My last encounter with her was on 02/22/21 Pain Assessment: Severity of Chronic pain is reported as a 10-Worst pain ever/10. Location: Shoulder (left)  /pain radiaties down her left arm to her fingers. Onset: More than a month ago. Quality: Tingling, Dull, Constant. Timing: Constant. Modifying factor(s): nothing. Vitals:  height is _0  (1.575 m) and weight is 145 lb (65.8 kg). Her temperature is 97.1 F (36.2 C) (abnormal). Her blood pressure is 115/79 and her pulse is 87. Her oxygen saturation is 98%.   Reason  for encounter:   Patient follows up today for medication management.  Unfortunately no benefit with Cymbalta or gabapentin.  She is also tried Lyrica in the past which was not effective.  We discussed introduction of amitriptyline 25 mg nightly. Given lack of response with physical therapy and medication management, we discussed left suprascapular nerve block for left shoulder pain as well as trigger point injections for trapezius tightness.   02/22/21 Patient presents for second visit today.  Unfortunately no benefit with Cymbalta.  I have provided her instructions to how to reduce/discontinue.  She was also started on Lyrica but that caused side effects of nausea and sedation.  We will retrial gabapentin as below.  Also recommend compounded neuropathic cream that she can apply to her left breast and chest region.  HPI from initial clinic visit 12/17/2020: Sherry Short is a 46 year old female with a history of left breast lumpectomy for DCIS September 2018 with Dr. Lake Bells status post left breast excisional biopsy February 2019.  She is undergoing postop radiation and has been on tamoxifen.  Since her surgery and radiation, she has developed neuropathic pain of her left breast.  This is very painful.  Of note she was incarcerated for the last 6 months.  Majority of her pain is along her left breast, left axilla, left posterior thoracic region.  Of note she has tried gabapentin in the past, Mobic, acetaminophen with limited response.  She has had an intercostal nerve block done in the past, without any imaging guidance which she states was not effective.   Of note a Spanish interpreter was present at all times during the visit.  ROS  Constitutional: Denies any fever or chills Gastrointestinal: No reported hemesis, hematochezia, vomiting, or acute GI distress Musculoskeletal:  Left shoulder  pain Neurological:  Burning, tingling, pruritus of the left breast/chest wall  Medication Review  NONFORMULARY OR  COMPOUNDED ITEM, amitriptyline, and tamoxifen  History Review  Allergy: Sherry Short has No Known Allergies. Drug: Sherry Short  reports no history of drug use. Alcohol:  reports no history of alcohol use. Tobacco:  reports that she has never smoked. She has never used smokeless tobacco. Social: Sherry Short  reports that she has never smoked. She has never used smokeless tobacco. She reports that she does not drink alcohol and does not use drugs. Medical:  has a past medical history of Anginal pain ([place]) (/), Anxiety, Cancer ([place]) (12/13/2016), Migraine headache (08/2017), Personal history of radiation therapy (2018), and Seasonal allergies. Surgical: [name]  has a past surgical history that includes Tubal ligation; Breast lumpectomy with sentinel lymph node bx (Left, 01/02/2017); Laparoscopic vaginal hysterectomy with salpingectomy (Bilateral, 09/18/2017); Laparoscopic ovarian cystectomy (Left, 09/18/2017); Breast lumpectomy (Left, 01/02/2017); Breast biopsy (Left, 11/30/2016); Breast biopsy (Left, 12/13/2016); Breast biopsy (Left, 12/13/2016); and Breast biopsy (Left, 06/01/2017). Family: family history includes Breast cancer (age of onset: 59) in her sister; Diabetes in her father; Healthy in her mother.  Laboratory Chemistry Profile   Renal Lab Results  Component Value Date   BUN 10 09/19/2017   CREATININE 0.58 09/19/2017   GFRAA >60 09/19/2017   GFRNONAA >60 09/19/2017    Hepatic Lab Results  Component Value Date   AST 18 08/09/2015   ALT 13 (L) 08/09/2015   ALBUMIN 4.3 08/09/2015   ALKPHOS 68 08/09/2015   LIPASE 19 08/09/2015    Electrolytes Lab Results  Component Value Date   NA 137 09/19/2017   K 4.1 09/19/2017   CL 110 09/19/2017   CALCIUM 8.4 (L) 09/19/2017    Bone No results found for: VD25OH, VD125OH2TOT, PY1950DT2, IZ1245YK9, 25OHVITD1, 25OHVITD2, 25OHVITD3, TESTOFREE, TESTOSTERONE  Inflammation (CRP: Acute Phase) (ESR: Chronic Phase) No results found for: CRP,  ESRSEDRATE, LATICACIDVEN       Note: Above Lab results reviewed.  Recent Imaging Review  MM 3D SCREEN BREAST BILATERAL CLINICAL DATA:  Screening.  EXAM: DIGITAL SCREENING BILATERAL MAMMOGRAM WITH TOMOSYNTHESIS AND CAD  TECHNIQUE: Bilateral screening digital craniocaudal and mediolateral oblique mammograms were obtained. Bilateral screening digital breast tomosynthesis was performed. The images were evaluated with computer-aided detection.  COMPARISON:  Previous exam(s).  ACR Breast Density Category c: The breast tissue is heterogeneously dense, which may obscure small masses.  FINDINGS: There are no findings suspicious for malignancy.  IMPRESSION: No mammographic evidence of malignancy. A result letter of this screening mammogram will be mailed directly to the patient.  RECOMMENDATION: Screening mammogram in one year. (Code:SM-B-01Y)  BI-RADS CATEGORY  1: Negative.  Electronically Signed   By: Kristopher Oppenheim M.D.   On: 12/01/2020 14:26  Note: Reviewed        Physical Exam  General appearance: Well nourished, well developed, and well hydrated. In no apparent acute distress Mental status: Alert, oriented x 3 (person, place, & time)       Respiratory: No evidence of acute respiratory distress Eyes: PERLA Vitals: BP 115/79    Pulse 87    Temp (!) 97.1 F (36.2 C)    Ht _0  (1.575 m)    Wt 145 lb (65.8 kg)    LMP 09/04/2017 (Exact Date)    SpO2 98%    BMI 26.52 kg/m  BMI: Estimated body mass index is 26.52 kg/m as calculated from the following:   Height as of this encounter: _1  (  1.575 m).   Weight as of this encounter: 145 lb (65.8 kg). Ideal: Ideal body weight: 50.1 kg (110 lb 7.2 oz) Adjusted ideal body weight: 56.4 kg (124 lb 4.3 oz)  Pain along left lateral breast, left axilla, left posterior lateral thoracic region. Pain with left shoulder abduction, left shoulder pain, arthropathic pain pattern   5 out of 5 strength bilateral upper extremity: Shoulder  abduction, elbow flexion, elbow extension, thumb extension. 5 out of 5 strength bilateral lower extremity: Plantar flexion, dorsiflexion, knee flexion, knee extension.  Assessment   Status Diagnosis  Persistent Persistent Persistent 1. Post-mastectomy pain syndrome   2. Cervicalgia   3. Neuropathic pain   4. Injury of long thoracic nerve, sequela   5. Left-sided chest pain   6. Chronic pain syndrome   7. Disorder of left suprascapular nerve   8. Chronic left shoulder pain        Plan of Care    Ms. Delena Casebeer Mcnee has a current medication list which includes the following long-term medication(s): amitriptyline.  Pharmacotherapy (Medications Ordered): Meds ordered this encounter  Medications   amitriptyline (ELAVIL) 25 MG tablet    Sig: Take 1 tablet (25 mg total) by mouth at bedtime.    Dispense:  30 tablet    Refill:  2   Orders Placed This Encounter  Procedures   SUPRASCAPULAR NERVE BLOCK    For shoulder pain.    Standing Status:   Future    Standing Expiration Date:   07/28/2021    Scheduling Instructions:     LEFT     Level(s): Suprascapular notch     Sedation: Patient's choice.     Scheduling Timeframe: As permitted by the schedule    Order Specific Question:   Where will this procedure be performed?    Answer:   [facility]   TRIGGER POINT INJECTION    Standing Status:   Future    Standing Expiration Date:   07/28/2021    Scheduling Instructions:     Left trapezius    Order Specific Question:   Where will this procedure be performed?    Answer:   [facility]     Follow-up plan:   Return in about 2 weeks (around 05/13/2021) for Left TPI + Left Suprscapular NB , without sedation.    Recent Visits Date Type Provider Dept  02/22/21 Office Visit Gillis Santa, MD [facility]  Showing recent visits within past 90 days and meeting all other requirements Today's Visits Date Type Provider Dept  04/29/21 Office Visit Gillis Santa,  MD [facility]  Showing today's visits and meeting all other requirements Future Appointments Date Type Provider Dept  05/12/21 Appointment Gillis Santa, MD [facility]  Showing future appointments within next 90 days and meeting all other requirements  I discussed the assessment and treatment plan with the patient. The patient was provided an opportunity to ask questions and all were answered. The patient agreed with the plan and demonstrated an understanding of the instructions.  Patient advised to call back or seek an in-person evaluation if the symptoms or condition worsens.  Duration of encounter: 27mnutes.  Note by: BGillis Santa MD Date: 04/29/2021; Time: 2:21 PM

## 2021-04-29 NOTE — Patient Instructions (Signed)
____________________________________________________________________________________________  Preparing for your procedure (without sedation)  Procedure appointments are limited to planned procedures: . No Prescription Refills. . No disability issues will be discussed. . No medication changes will be discussed.  Instructions: . Oral Intake: Do not eat or drink anything for at least 6 hours prior to your procedure. (Exception: Blood Pressure Medication. See below.) . Transportation: Unless otherwise stated by your physician, you may drive yourself after the procedure. . Blood Pressure Medicine: Do not forget to take your blood pressure medicine with a sip of water the morning of the procedure. If your Diastolic (lower reading)is above 100 mmHg, elective cases will be cancelled/rescheduled. . Blood thinners: These will need to be stopped for procedures. Notify our staff if you are taking any blood thinners. Depending on which one you take, there will be specific instructions on how and when to stop it. . Diabetics on insulin: Notify the staff so that you can be scheduled 1st case in the morning. If your diabetes requires high dose insulin, take only  of your normal insulin dose the morning of the procedure and notify the staff that you have done so. . Preventing infections: Shower with an antibacterial soap the morning of your procedure.  . Build-up your immune system: Take 1000 mg of Vitamin C with every meal (3 times a day) the day prior to your procedure. . Antibiotics: Inform the staff if you have a condition or reason that requires you to take antibiotics before dental procedures. . Pregnancy: If you are pregnant, call and cancel the procedure. . Sickness: If you have a cold, fever, or any active infections, call and cancel the procedure. . Arrival: You must be in the facility at least 30 minutes prior to your scheduled procedure. . Children: Do not bring any children with you. . Dress  appropriately: Bring dark clothing that you would not mind if they get stained. . Valuables: Do not bring any jewelry or valuables.  Reasons to call and reschedule or cancel your procedure: (Following these recommendations will minimize the risk of a serious complication.) . Surgeries: Avoid having procedures within 2 weeks of any surgery. (Avoid for 2 weeks before or after any surgery). . Flu Shots: Avoid having procedures within 2 weeks of a flu shots or . (Avoid for 2 weeks before or after immunizations). . Barium: Avoid having a procedure within 7-10 days after having had a radiological study involving the use of radiological contrast. (Myelograms, Barium swallow or enema study). . Heart attacks: Avoid any elective procedures or surgeries for the initial 6 months after a "Myocardial Infarction" (Heart Attack). . Blood thinners: It is imperative that you stop these medications before procedures. Let us know if you if you take any blood thinner.  . Infection: Avoid procedures during or within two weeks of an infection (including chest colds or gastrointestinal problems). Symptoms associated with infections include: Localized redness, fever, chills, night sweats or profuse sweating, burning sensation when voiding, cough, congestion, stuffiness, runny nose, sore throat, diarrhea, nausea, vomiting, cold or Flu symptoms, recent or current infections. It is specially important if the infection is over the area that we intend to treat. . Heart and lung problems: Symptoms that may suggest an active cardiopulmonary problem include: cough, chest pain, breathing difficulties or shortness of breath, dizziness, ankle swelling, uncontrolled high or unusually low blood pressure, and/or palpitations. If you are experiencing any of these symptoms, cancel your procedure and contact your primary care physician for an evaluation.  Remember:  Regular   Business hours are:  Monday to Thursday 8:00 AM to 4:00  PM  Provider's Schedule: Sherry Naveira, MD:  Procedure days: Tuesday and Thursday 7:30 AM to 4:00 PM  Sherry Lateef, MD:  Procedure days: Monday and Wednesday 7:30 AM to 4:00 PM ____________________________________________________________________________________________    

## 2021-05-04 ENCOUNTER — Telehealth: Payer: Self-pay | Admitting: Student in an Organized Health Care Education/Training Program

## 2021-05-04 NOTE — Telephone Encounter (Signed)
We do not have a copy of patient's insurance card. Called pharmacy to get this info. She has Medicaid, [place] [id number] O. However, she can get it for $15.39 with a discount card. Attempted to call patient to tell her this, message left.

## 2021-05-04 NOTE — Telephone Encounter (Signed)
Called [facility], Amitriptyline not covered. Will do a PA. Patient notified.

## 2021-05-04 NOTE — Telephone Encounter (Signed)
Patients daughter called saying the pharmacy did not get medications and told them they had to be sent in again. Please check with pharmacy and let patient know when they can pick up medications.

## 2021-05-04 NOTE — Telephone Encounter (Signed)
Tried to call back on two different numbers. No answer. Spanish speaking so I did not leave a VM.

## 2021-05-12 ENCOUNTER — Other Ambulatory Visit: Payer: Self-pay

## 2021-05-12 ENCOUNTER — Ambulatory Visit
Admission: RE | Admit: 2021-05-12 | Discharge: 2021-05-12 | Disposition: A | Discharge status: Home / Self Care | Payer: Self-pay | Source: Ambulatory Visit | Attending: Student in an Organized Health Care Education/Training Program | Admitting: Student in an Organized Health Care Education/Training Program

## 2021-05-12 ENCOUNTER — Encounter: Payer: Self-pay | Admitting: Student in an Organized Health Care Education/Training Program

## 2021-05-12 ENCOUNTER — Ambulatory Visit: Payer: Self-pay | Admitting: Student in an Organized Health Care Education/Training Program

## 2021-05-12 VITALS — BP 136/86 | HR 83 | Temp 97.9°F | Resp 23 | Ht 62.0 in | Wt 147.0 lb

## 2021-05-12 DIAGNOSIS — G894 Chronic pain syndrome: Secondary | ICD-10-CM | POA: Insufficient documentation

## 2021-05-12 DIAGNOSIS — G5682 Other specified mononeuropathies of left upper limb: Secondary | ICD-10-CM | POA: Insufficient documentation

## 2021-05-12 DIAGNOSIS — G8928 Other chronic postprocedural pain: Secondary | ICD-10-CM | POA: Insufficient documentation

## 2021-05-12 DIAGNOSIS — M25512 Pain in left shoulder: Secondary | ICD-10-CM | POA: Insufficient documentation

## 2021-05-12 DIAGNOSIS — G8929 Other chronic pain: Secondary | ICD-10-CM

## 2021-05-12 MED ORDER — ROPIVACAINE HCL 2 MG/ML IJ SOLN
INTRAMUSCULAR | Status: AC
  Filled 2021-05-12: qty 20

## 2021-05-12 MED ORDER — DEXAMETHASONE SODIUM PHOSPHATE 10 MG/ML IJ SOLN
INTRAMUSCULAR | Status: AC
  Filled 2021-05-12: qty 1

## 2021-05-12 MED ORDER — IOHEXOL 180 MG/ML  SOLN
10.0000 mL | Freq: Once | INTRAMUSCULAR | Status: AC
  Administered 2021-05-12: 5 mL via INTRA_ARTICULAR

## 2021-05-12 MED ORDER — ROPIVACAINE HCL 2 MG/ML IJ SOLN
9.0000 mL | Freq: Once | INTRAMUSCULAR | Status: AC
  Administered 2021-05-12: 9 mL via PERINEURAL

## 2021-05-12 MED ORDER — LIDOCAINE HCL 2 % IJ SOLN
INTRAMUSCULAR | Status: AC
  Filled 2021-05-12: qty 20

## 2021-05-12 MED ORDER — LIDOCAINE HCL 2 % IJ SOLN
20.0000 mL | Freq: Once | INTRAMUSCULAR | Status: AC
  Administered 2021-05-12: 400 mg

## 2021-05-12 MED ORDER — DEXAMETHASONE SODIUM PHOSPHATE 10 MG/ML IJ SOLN
10.0000 mg | Freq: Once | INTRAMUSCULAR | Status: AC
  Administered 2021-05-12: 10 mg

## 2021-05-12 NOTE — Progress Notes (Signed)
Safety precautions to be maintained throughout the outpatient stay will include: orient to surroundings, keep bed in low position, maintain call bell within reach at all times, provide assistance with transfer out of bed and ambulation.  

## 2021-05-12 NOTE — Patient Instructions (Signed)

## 2021-05-12 NOTE — Progress Notes (Signed)
PROVIDER NOTE: Interpretation of information contained herein should be left to medically-trained personnel. Specific patient instructions are provided elsewhere under "Patient Instructions" section of medical record. This document was created in part using STT-dictation technology, any transcriptional errors that may result from this process are unintentional.  Patient: Sherry Short Type: Established DOB: [birth year] MRN: [id number] PCP: Medicine, Luvenia Heller Family  Service: Procedure DOS: 05/12/2021 Setting: Ambulatory Location: Ambulatory outpatient facility Delivery: Face-to-face Provider: Gillis Santa, MD Specialty: Interventional Pain Management Specialty designation: 09 Location: Outpatient facility Ref. Prov.: Medicine, Arletha Pili*    Primary Reason for Visit: Interventional Pain Management Treatment. CC: Shoulder Pain (Left )  Procedure:           Type: Suprascapular nerve block #1  Laterality:  Left Level: Superior to scapular spine, lateral to supraspinatus fossa (Suprascapular notch).  Purpose: Diagnostic/Therapeutic Date: 05/12/2021 Performed by: Gillis Santa, MD Anesthesia: Local (1-2% Lidocaine)  Anxiolysis: None  Sedation: None  Guidance: Fluoroscopy          Indications: Shoulder pain, severe enough to impact quality of life and/or function. 1. Disorder of left suprascapular nerve   2. Chronic left shoulder pain   3. Post-mastectomy pain syndrome   4. Chronic pain syndrome    NAS-11 score:   Pre-procedure: 10-Worst pain ever/10   Post-procedure: 10-Worst pain ever (with ROM exercise reports that pain is better in the front but when rotating backwards still a lot of pain.)/10     Target: Suprascapular nerve Location: midway between the medial border of the scapula and the acromion as it runs through the suprascapular notch. Region: Suprascapular, posterior shoulder  Approach: Percutaneous  Neuroanatomy: The suprascapular nerve is the lateral branch of the  superior trunk of the brachial plexus. It receives nerve fibers that originate in the nerve roots C5 and C6 (and sometimes C4). It is a mixed nerve, meaning that it provides both sensory and motor supply for the suprascapular region. Function: The main function of this nerve is to provide motor innervation for two muscles, the supraspinatus and infraspinatus muscles. They are part of the rotator cuff muscles. In addition, the suprascapular nerve provides a sensory supply to the joints of the scapula (glenohumeral and acromioclavicular joints). Rationale (medical necessity): procedure needed and proper for the diagnosis and/or treatment of the patient's medical symptoms and needs.  Position / Prep / Materials:  Position: Prone Materials:  Tray: Block Needle(s):  Type: Spinal  Gauge (G): 25  Length: 3.5 in.  Qty: 1 Prep solution: DuraPrep (Iodine Povacrylex [0.7% available iodine] and Isopropyl Alcohol, 74% w/w) Prep Area: Entire posterior shoulder area. From upper spine to shoulder proper (upper arm), and from lateral neck to lower tip of shoulder blade.   Pre-op H&P Assessment:  Sherry Short is a 46 y.o. (year old), female patient, seen today for interventional treatment. She  has a past surgical history that includes Tubal ligation; Breast lumpectomy with sentinel lymph node bx (Left, 01/02/2017); Laparoscopic vaginal hysterectomy with salpingectomy (Bilateral, 09/18/2017); Laparoscopic ovarian cystectomy (Left, 09/18/2017); Breast lumpectomy (Left, 01/02/2017); Breast biopsy (Left, 11/30/2016); Breast biopsy (Left, 12/13/2016); Breast biopsy (Left, 12/13/2016); and Breast biopsy (Left, 06/01/2017). Ms. Drennen has a current medication list which includes the following prescription(s): amitriptyline, NONFORMULARY OR COMPOUNDED ITEM, and tamoxifen. Her primarily concern today is the Shoulder Pain (Left )  Initial Pieri Signs:  Pulse/HCG Rate: 83ECG Heart Rate: 91 Temp: 97.9 F (36.6 C) Resp: 18 BP:  129/82 SpO2: 100 %  BMI: Estimated body mass index is 26.89 kg/m as  calculated from the following:   Height as of this encounter: 5\' 2"  (1.575 m).   Weight as of this encounter: 147 lb (66.7 kg).  Risk Assessment: Allergies: Reviewed. She has No Known Allergies.  Allergy Precautions: None required Coagulopathies: Reviewed. None identified.  Blood-thinner therapy: None at this time Active Infection(s): Reviewed. None identified. Ms. Karbowski is afebrile  Site Confirmation: Ms. Apple was asked to confirm the procedure and laterality before marking the site Procedure checklist: Completed Consent: Before the procedure and under the influence of no sedative(s), amnesic(s), or anxiolytics, the patient was informed of the treatment options, risks and possible complications. To fulfill our ethical and legal obligations, as recommended by the American Medical Association's Code of Ethics, I have informed the patient of my clinical impression; the nature and purpose of the treatment or procedure; the risks, benefits, and possible complications of the intervention; the alternatives, including doing nothing; the risk(s) and benefit(s) of the alternative treatment(s) or procedure(s); and the risk(s) and benefit(s) of doing nothing. The patient was provided information about the general risks and possible complications associated with the procedure. These may include, but are not limited to: failure to achieve desired goals, infection, bleeding, organ or nerve damage, allergic reactions, paralysis, and death. In addition, the patient was informed of those risks and complications associated to the procedure, such as failure to decrease pain; infection; bleeding; organ or nerve damage with subsequent damage to sensory, motor, and/or autonomic systems, resulting in permanent pain, numbness, and/or weakness of one or several areas of the body; allergic reactions; (i.e.: anaphylactic reaction); and/or  death. Furthermore, the patient was informed of those risks and complications associated with the medications. These include, but are not limited to: allergic reactions (i.e.: anaphylactic or anaphylactoid reaction(s)); adrenal axis suppression; blood sugar elevation that in diabetics may result in ketoacidosis or comma; water retention that in patients with history of congestive heart failure may result in shortness of breath, pulmonary edema, and decompensation with resultant heart failure; weight gain; swelling or edema; medication-induced neural toxicity; particulate matter embolism and blood vessel occlusion with resultant organ, and/or nervous system infarction; and/or aseptic necrosis of one or more joints. Finally, the patient was informed that Medicine is not an exact science; therefore, there is also the possibility of unforeseen or unpredictable risks and/or possible complications that may result in a catastrophic outcome. The patient indicated having understood very clearly. We have given the patient no guarantees and we have made no promises. Enough time was given to the patient to ask questions, all of which were answered to the patient's satisfaction. Ms. Bardon has indicated that she wanted to continue with the procedure. Attestation: I, the ordering provider, attest that I have discussed with the patient the benefits, risks, side-effects, alternatives, likelihood of achieving goals, and potential problems during recovery for the procedure that I have provided informed consent. Date   Time: 05/12/2021 10:22 AM  Pre-Procedure Preparation:  Monitoring: As per clinic protocol. Respiration, ETCO2, SpO2, BP, heart rate and rhythm monitor placed and checked for adequate function Safety Precautions: Patient was assessed for positional comfort and pressure points before starting the procedure. Time-out: I initiated and conducted the "Time-out" before starting the procedure, as per protocol. The patient  was asked to participate by confirming the accuracy of the "Time Out" information. Verification of the correct person, site, and procedure were performed and confirmed by me, the nursing staff, and the patient. "Time-out" conducted as per Joint Commission's Universal Protocol (UP.01.01.01). Time: 1059  Description  of Procedure:          Procedural Technique Safety Precautions: Aspiration looking for blood return was conducted prior to all injections. At no point did we inject any substances, as a needle was being advanced. No attempts were made at seeking any paresthesias. Safe injection practices and needle disposal techniques used. Medications properly checked for expiration dates. SDV (single dose vial) medications used. Description of the Procedure: Protocol guidelines were followed. The patient was placed in position over the procedure table. The target area was identified and the area prepped in the usual manner. Skin & deeper tissues infiltrated with local anesthetic. Appropriate amount of time allowed to pass for local anesthetics to take effect. The procedure needles were then advanced to the target area. Proper needle placement secured. Negative aspiration confirmed. Solution injected in intermittent fashion, asking for systemic symptoms every 0.5cc of injectate. The needles were then removed and the area cleansed, making sure to leave some of the prepping solution back to take advantage of its long term bactericidal properties.  Vitals:   05/12/21 1025 05/12/21 1056 05/12/21 1100 05/12/21 1106  BP: 129/82 131/86 128/87 136/86  Pulse: 83     Resp: 18 20 (!) 23 (!) 23  Temp: 97.9 F (36.6 C)     TempSrc: Oral     SpO2: 100% 98% 100% 100%  Weight: 147 lb (66.7 kg)     Height: 5\' 2"  (1.575 m)       Start Time: 1059 hrs. End Time: 1105 hrs. Materials:  Needle(s) Type: Spinal Needle Gauge: 22G Length: 3.5-in Medication(s): Please see orders for medications and dosing details. 5 cc  solution made of 4 cc of 0.2% Ropivacaine, 1 cc of Decadron 10 mg/cc.  Injected along the left suprascapular notch after contrast confirmation.  Afterwards a series of trigger point injections were also done in the left trapezius & periscapular region.  0.5 to 1 cc of 0.2% was injected into the trigger point after needling. Imaging Guidance (Non-Spinal):          Type of Imaging Technique: Fluoroscopy Guidance (Non-Spinal) Indication(s): Assistance in needle guidance and placement for procedures requiring needle placement in or near specific anatomical locations not easily accessible without such assistance. Exposure Time: Please see nurses notes. Contrast: Before injecting any contrast, we confirmed that the patient did not have an allergy to iodine, shellfish, or radiological contrast. Once satisfactory needle placement was completed at the desired level, radiological contrast was injected. Contrast injected under live fluoroscopy. No contrast complications. See chart for type and volume of contrast used. Fluoroscopic Guidance: I was personally present during the use of fluoroscopy. "Tunnel Vision Technique" used to obtain the best possible view of the target area. Parallax error corrected before commencing the procedure. "Direction-depth-direction" technique used to introduce the needle under continuous pulsed fluoroscopy. Once target was reached, antero-posterior, oblique, and lateral fluoroscopic projection used confirm needle placement in all planes. Images permanently stored in EMR. Interpretation: I personally interpreted the imaging intraoperatively. Adequate needle placement confirmed in multiple planes. Appropriate spread of contrast into desired area was observed. No evidence of afferent or efferent intravascular uptake. Permanent images saved into the patient's record.  Post-operative Assessment:  Post-procedure Trice Signs:  Pulse/HCG Rate: 8381 Temp: 97.9 F (36.6 C) Resp: (!) 23 BP:  136/86 SpO2: 100 %  EBL: None  Complications: No immediate post-treatment complications observed by team, or reported by patient.  Note: The patient tolerated the entire procedure well. A repeat set of vitals were taken  after the procedure and the patient was kept under observation following institutional policy, for this type of procedure. Post-procedural neurological assessment was performed, showing return to baseline, prior to discharge. The patient was provided with post-procedure discharge instructions, including a section on how to identify potential problems. Should any problems arise concerning this procedure, the patient was given instructions to immediately contact us, at any time, without hesitation. In any case, we plan to contact the patient by telephone for a follow-up status report regarding this interventional procedure.  Comments:  No additional relevant information.  Plan of Care  Orders:  Orders Placed This Encounter  Procedures   DG PAIN CLINIC C-ARM 1-60 MIN NO REPORT    Intraoperative interpretation by procedural physician at [place].    Standing Status:   Standing    Number of Occurrences:   1    Order Specific Question:   Reason for exam:    Answer:   Assistance in needle guidance and placement for procedures requiring needle placement in or near specific anatomical locations not easily accessible without such assistance.   Medications ordered for procedure: Meds ordered this encounter  Medications   iohexol (OMNIPAQUE) 180 MG/ML injection 10 mL    Must be Myelogram-compatible. If not available, you may substitute with a water-soluble, non-ionic, hypoallergenic, myelogram-compatible radiological contrast medium.   lidocaine (XYLOCAINE) 2 % (with pres) injection 400 mg   dexamethasone (DECADRON) injection 10 mg   ropivacaine (PF) 2 mg/mL (0.2%) (NAROPIN) injection 9 mL   Medications administered: We administered iohexol, lidocaine, dexamethasone,  and ropivacaine (PF) 2 mg/mL (0.2%).  See the medical record for exact dosing, route, and time of administration.  Follow-up plan:   Return in about 4 weeks (around 06/09/2021) for Post Procedure Evaluation, virtual.       Left SSNB + TPI 05/12/21   Recent Visits Date Type Provider Dept  04/29/21 Office Visit Gillis Santa, MD [facility]  02/22/21 Office Visit Gillis Santa, MD [facility]  Showing recent visits within past 90 days and meeting all other requirements Today's Visits Date Type Provider Dept  05/12/21 Procedure visit Gillis Santa, MD [facility]  Showing today's visits and meeting all other requirements Future Appointments Date Type Provider Dept  06/09/21 Appointment Gillis Santa, MD [facility]  Showing future appointments within next 90 days and meeting all other requirements  Disposition: Discharge home  Discharge (Date   Time): 05/12/2021; 1111 hrs.   Primary Care Physician: Medicine, Luvenia Heller Family Location: [facility] Note by: Gillis Santa, MD Date: 05/12/2021; Time: 11:46 AM  Disclaimer:  Medicine is not an exact science. The only guarantee in medicine is that nothing is guaranteed. It is important to note that the decision to proceed with this intervention was based on the information collected from the patient. The Data and conclusions were drawn from the patient's questionnaire, the interview, and the physical examination. Because the information was provided in large part by the patient, it cannot be guaranteed that it has not been purposely or unconsciously manipulated. Every effort has been made to obtain as much relevant data as possible for this evaluation. It is important to note that the conclusions that lead to this procedure are derived in large part from the available data. Always take into account that the treatment will also be dependent on availability of resources and existing  treatment guidelines, considered by other Pain Management Practitioners as being common knowledge and practice, at the time  of the intervention. For Medico-Legal purposes, it is also important to point out that variation in procedural techniques and pharmacological choices are the acceptable norm. The indications, contraindications, technique, and results of the above procedure should only be interpreted and judged by a Board-Certified Interventional Pain Specialist with extensive familiarity and expertise in the same exact procedure and technique.

## 2021-05-13 ENCOUNTER — Telehealth: Payer: Self-pay | Admitting: *Deleted

## 2021-05-13 NOTE — Telephone Encounter (Signed)
Attempted to call for post procedure follow-up. Message left. 

## 2021-05-17 ENCOUNTER — Telehealth: Payer: Self-pay | Admitting: Student in an Organized Health Care Education/Training Program

## 2021-05-17 NOTE — Telephone Encounter (Signed)
Spoke with Spanish interpretor per language line. Patient advised that it has only been 5 days since SSNB. Continue to apply ice/heat, rest, wait at least 3 days and call us back if pain still 10/10. Patient verbalized understanding.

## 2021-06-09 ENCOUNTER — Encounter: Payer: Self-pay | Admitting: Student in an Organized Health Care Education/Training Program

## 2021-06-09 ENCOUNTER — Other Ambulatory Visit: Payer: Self-pay

## 2021-06-09 ENCOUNTER — Ambulatory Visit
Payer: Medicaid Other | Attending: Student in an Organized Health Care Education/Training Program | Admitting: Student in an Organized Health Care Education/Training Program

## 2021-06-09 DIAGNOSIS — G894 Chronic pain syndrome: Secondary | ICD-10-CM

## 2021-06-09 DIAGNOSIS — G8928 Other chronic postprocedural pain: Secondary | ICD-10-CM

## 2021-06-09 DIAGNOSIS — G8929 Other chronic pain: Secondary | ICD-10-CM

## 2021-06-09 DIAGNOSIS — G5682 Other specified mononeuropathies of left upper limb: Secondary | ICD-10-CM

## 2021-06-09 DIAGNOSIS — M25512 Pain in left shoulder: Secondary | ICD-10-CM

## 2021-06-09 NOTE — Progress Notes (Signed)
Patient: Sherry Short  Service Category: E/M  Provider: Gillis Santa, MD  ?DOB: Jan 13, 1976  DOS: 06/09/2021  Location: Office  ?MRN: [id number]  Setting: Ambulatory outpatient  Referring Provider: Medicine, Arletha Pili*  ?Type: Established Patient  Specialty: Interventional Pain Management  PCP: Medicine, [facility]  ?Location: Remote location  Delivery: TeleHealth    ? ?Virtual Encounter - Pain Management ?PROVIDER NOTE: Information contained herein reflects review and annotations entered in association with encounter. Interpretation of such information and data should be left to medically-trained personnel. Information provided to patient can be located elsewhere in the medical record under "Patient Instructions". Document created using STT-dictation technology, any transcriptional errors that may result from process are unintentional.  ?  ?Contact & Pharmacy ?Preferred: [phone] ?Home: [phone] (home) ?Mobile: [phone] (mobile) ?E-mail: [email or web address]  ?[facility] [address] [place], [place] - [address] ?[address] ?[place] [address] ?Phone: [phone] Fax: [phone] ? ?[facility] [place], [place] ?[place] ?[place] [address] ?Phone: [phone] Fax: [phone] ?  ?Pre-screening  ?Sherry Short offered "in-person" vs "virtual" encounter. She indicated preferring virtual for this encounter.  ? ?Reason ?COVID-19*  Social distancing based on CDC and AMA recommendations.  ? ?I contacted Sherry Short on 06/09/2021 via telephone.      I clearly identified myself as Gillis Santa, MD. I verified that I was speaking with the correct person using two identifiers (Name: Sherry Short, and date of birth: 1975-07-12). ? ?Consent ?I sought verbal advanced consent from Sherry Short for virtual visit interactions. I informed Sherry Short of possible security and privacy concerns, risks, and limitations  associated with providing "not-in-person" medical evaluation and management services. I also informed Sherry Short of the availability of "in-person" appointments. Finally, I informed her that there would be a charge for the virtual visit and that she could be  personally, fully or partially, financially responsible for it. Sherry Short expressed understanding and agreed to proceed.  ? ?Historic Elements   ?Sherry Short is a 46 y.o. year old, female patient evaluated today after our last contact on 05/17/2021. Sherry Short  has a past medical history of Anginal pain ([place]) (/), Anxiety, Cancer ([place]) (12/13/2016), Migraine headache (08/2017), Personal history of radiation therapy (2018), and Seasonal allergies. She also  has a past surgical history that includes Tubal ligation; Breast lumpectomy with sentinel lymph node bx (Left, 01/02/2017); Laparoscopic vaginal hysterectomy with salpingectomy (Bilateral, 09/18/2017); Laparoscopic ovarian cystectomy (Left, 09/18/2017); Breast lumpectomy (Left, 01/02/2017); Breast biopsy (Left, 11/30/2016); Breast biopsy (Left, 12/13/2016); Breast biopsy (Left, 12/13/2016); and Breast biopsy (Left, 06/01/2017). Sherry Short has a current medication list which includes the following prescription(s): amitriptyline and tamoxifen. She  reports that she has never smoked. She has never used smokeless tobacco. She reports that she does not drink alcohol and does not use drugs. Sherry Short has No Known Allergies.  ? ?HPI  ?Today, she is being contacted for a post-procedure assessment. ? ? ?Post-procedure evaluation  ?  Type: Suprascapular nerve block #1  ?Laterality:  Left ?Level: Superior to scapular spine, lateral to supraspinatus fossa (Suprascapular notch).  ?Purpose: Diagnostic/Therapeutic ?Date: 05/12/2021 ?Performed by: Gillis Santa, MD ?Anesthesia: Local (1-2% Lidocaine)  ?Anxiolysis: None  ?Sedation: None  ?Guidance: Fluoroscopy         ? ?Indications: Shoulder pain, severe enough to impact  quality of life and/or function. ?1. Disorder of left suprascapular nerve   ?  2. Chronic left shoulder pain   ?3. Post-mastectomy pain syndrome   ?4. Chronic pain syndrome   ? ?NAS-11 score:  ? Pre-procedure: 10-Worst pain ever/10  ? Post-procedure: 10-Worst pain ever (with ROM exercise reports that pain is better in the front but when rotating backwards still a lot of pain.)/10  ? ?   ?Effectiveness:  ?Initial hour after procedure: 0 %  ?Subsequent 4-6 hours post-procedure: 0 %  ?Analgesia past initial 6 hours: 0 %  ?Ongoing improvement:  ?Analgesic:  0% ? ? ? ?Laboratory Chemistry Profile  ? ?Renal ?Lab Results  ?Component Value Date  ? BUN 10 09/19/2017  ? CREATININE 0.58 09/19/2017  ? GFRAA >60 09/19/2017  ? GFRNONAA >60 09/19/2017  ?  Hepatic ?Lab Results  ?Component Value Date  ? AST 18 08/09/2015  ? ALT 13 (L) 08/09/2015  ? ALBUMIN 4.3 08/09/2015  ? ALKPHOS 68 08/09/2015  ? LIPASE 19 08/09/2015  ?  ?Electrolytes ?Lab Results  ?Component Value Date  ? NA 137 09/19/2017  ? K 4.1 09/19/2017  ? CL 110 09/19/2017  ? CALCIUM 8.4 (L) 09/19/2017  ?  Bone ?No results found for: [place], [id number], [id number], [id number], 25OHVITD1, 25OHVITD2, 25OHVITD3, TESTOFREE, TESTOSTERONE  ?Inflammation (CRP: Acute Phase) (ESR: Chronic Phase) ?No results found for: CRP, ESRSEDRATE, LATICACIDVEN    ?  ? ?Note: Above Lab results reviewed. ? ? ?Assessment  ?The primary encounter diagnosis was Disorder of left suprascapular nerve. Diagnoses of Chronic left shoulder pain, Post-mastectomy pain syndrome, and Chronic pain syndrome were also pertinent to this visit. ? ?Plan of Care  ?Unfortunately, no benefit after left suprascapular nerve block and left trapezius and periscapular TPI. ?Patient's left breast pain and shoulder pain is likely due to postmastectomy pain syndrome.  She can consider a left PECS block. ?I do not perform these procedures.  I have instructed her to contact her primary care provider to request referral to [facility] or  [facility] for consideration of a left  PECS block ?Patient endorsed understanding. ? ? ?Follow-up plan:   ?No follow-ups on file.   ?  ?Left SSNB + TPI 05/12/21  ?  ?Recent Visits ?Date Type Provider Dept  ?05/12/21 Procedure visit Gillis Santa, MD [facility]  ?04/29/21 Office Visit Gillis Santa, MD [facility]  ?Showing recent visits within past 90 days and meeting all other requirements ?Today's Visits ?Date Type Provider Dept  ?06/09/21 Office Visit Gillis Santa, MD [facility]  ?Showing today's visits and meeting all other requirements ?Future Appointments ?No visits were found meeting these conditions. ?Showing future appointments within next 90 days and meeting all other requirements ? ?I discussed the assessment and treatment plan with the patient. The patient was provided an opportunity to ask questions and all were answered. The patient agreed with the plan and demonstrated an understanding of the instructions. ? ?Patient advised to call back or seek an in-person evaluation if the symptoms or condition worsens. ? ?Duration of encounter: 88mnutes. ? ?Note by: BGillis Santa MD ?Date: 06/09/2021; Time: 3:25 PM ?

## 2021-06-16 ENCOUNTER — Ambulatory Visit: Payer: Medicaid Other | Admitting: Student in an Organized Health Care Education/Training Program

## 2021-06-18 NOTE — Progress Notes (Unsigned)
Sherry Short  Telephone:([phone]) [phone] Fax:(336) [phone]  ID: Sherry Short OB: 14-Apr-1975  MR#: [id number]  GQQ#:[id number]  Patient Care Team: Medicine, [facility] Family as PCP - General  CHIEF COMPLAINT: Left breast DCIS.  INTERVAL HISTORY: Patient was last evaluated in clinic in March 2020.  She returns today to reestablish care and reinitiate tamoxifen.  She continues to have left-sided shoulder and back pain that is actively being evaluated and treated in pain clinic.  She otherwise feels well.  She has no neurologic complaints. She denies any recent fevers or illnesses. She has a good appetite and denies weight loss. She denies any other pain. She has no chest pain or shortness of breath. She denies any nausea, vomiting, constipation, or diarrhea. She has no urinary complaints.  Patient offers no further specific complaints today.  REVIEW OF SYSTEMS:   Review of Systems  Constitutional: Negative.  Negative for fever, malaise/fatigue and weight loss.  Respiratory: Negative.  Negative for cough and shortness of breath.   Cardiovascular: Negative.  Negative for chest pain and leg swelling.  Gastrointestinal: Negative.  Negative for abdominal pain.  Genitourinary:  Positive for flank pain. Negative for dysuria.  Musculoskeletal:  Positive for back pain and joint pain.  Skin: Negative.  Negative for rash.  Neurological: Negative.  Negative for dizziness, sensory change, weakness and headaches.  Psychiatric/Behavioral: Negative.  The patient is not nervous/anxious.    As per HPI. Otherwise, a complete review of systems is negative.  PAST MEDICAL HISTORY: Past Medical History:  Diagnosis Date   Anginal pain ([place]) /   chest pain probably related to anxiety, per pt.   Anxiety    Cancer ([place]) 12/13/2016   Left Breast (DCIS)   Migraine headache 08/2017   tylenol and pain medicine helps this   Personal history of radiation therapy 2018   left breast DCIS    Seasonal allergies     PAST SURGICAL HISTORY: Past Surgical History:  Procedure Laterality Date   BREAST BIOPSY Left 11/30/2016   left breast calcs LIQ high grade DCIS.  AND [place] bx 1:00 10 cmfn fibroadenoma and phyllodes tumor   BREAST BIOPSY Left 12/13/2016   Left Affrim Bx central calcs,  Ususal ductal hyperplasia   BREAST BIOPSY Left 12/13/2016   Left Affirm Bx- LIQ calcs, columnar cell change, disconcordant and excision recommened.    BREAST BIOPSY Left 06/01/2017   Left Affirm- of LIQ calcs, re bx of area biopsied in 12/2016 that was disconcordant, epithelial changes, CALCIFICATION ASSOCIATED WITH RADIATION EFFECT and fibroadenomatoid.    BREAST LUMPECTOMY Left 01/02/2017   DCIS and fibroadenoma with phyllodes excisied. SN bx, LN negative   BREAST LUMPECTOMY WITH SENTINEL LYMPH NODE BIOPSY Left 01/02/2017   Procedure: BREAST LUMPECTOMY WITH SENTINEL LYMPH NODE BX;  Surgeon: Clayburn Pert, MD;  Location: [facility] ORS;  Service: General;  Laterality: Left;   LAPAROSCOPIC OVARIAN CYSTECTOMY Left 09/18/2017   Procedure: LAPAROSCOPIC OVARIAN CYSTECTOMY (x 2);  Surgeon: Schermerhorn, Gwen Her, MD;  Location: [facility] OR[facility];  Service: Gynecology;  Laterality: Left;   LAPAROSCOPIC VAGINAL HYSTERECTOMY WITH SALPINGECTOMY Bilateral 09/18/2017   Procedure: LAPAROSCOPIC ASSISTED VAGINAL HYSTERECTOMY WITH SALPINGECTOMY;  Surgeon: Schermerhorn, Gwen Her, MD;  Location: [facility] OR[facility];  Service: Gynecology;  Laterality: Bilateral;   TUBAL LIGATION      FAMILY HISTORY: Family History  Problem Relation Age of Onset   Breast cancer Sister 52   Healthy Mother    Diabetes Father     ADVANCED DIRECTIVES (Y/N):  N  HEALTH MAINTENANCE: Social History   Tobacco Use   Smoking status: Never   Smokeless tobacco: Never  Vaping Use   Vaping Use: Never used  Substance Use Topics   Alcohol use: No   Drug use: No     Colonoscopy:  PAP:  Bone density:  Lipid panel:  No Known Allergies  Current Outpatient  Medications  Medication Sig Dispense Refill   amitriptyline (ELAVIL) 25 MG tablet Take 1 tablet (25 mg total) by mouth at bedtime. 30 tablet 2   tamoxifen (NOLVADEX) 20 MG tablet Take 1 tablet (20 mg total) by mouth daily. 90 tablet 3   No current facility-administered medications for this visit.    OBJECTIVE: There were no vitals filed for this visit.    There is no height or weight on file to calculate BMI.    ECOG FS:0 - Asymptomatic  General: Well-developed, well-nourished, no acute distress. Eyes: Pink conjunctiva, anicteric sclera. HEENT: Normocephalic, moist mucous membranes. Breasts: Exam deferred today. Lungs: No audible wheezing or coughing. Heart: Regular rate and rhythm. Abdomen: Soft, nontender, no obvious distention. Musculoskeletal: No edema, cyanosis, or clubbing. Neuro: Alert, answering all questions appropriately. Cranial nerves grossly intact. Skin: No rashes or petechiae noted. Psych: Normal affect.   LAB RESULTS:  Lab Results  Component Value Date   NA 137 09/19/2017   K 4.1 09/19/2017   CL 110 09/19/2017   CO2 20 (L) 09/19/2017   GLUCOSE 124 (H) 09/19/2017   BUN 10 09/19/2017   CREATININE 0.58 09/19/2017   CALCIUM 8.4 (L) 09/19/2017   PROT 7.5 08/09/2015   ALBUMIN 4.3 08/09/2015   AST 18 08/09/2015   ALT 13 (L) 08/09/2015   ALKPHOS 68 08/09/2015   BILITOT 0.7 08/09/2015   GFRNONAA >60 09/19/2017   GFRAA >60 09/19/2017    Lab Results  Component Value Date   WBC 11.6 (H) 09/19/2017   HGB 10.1 (L) 09/19/2017   HCT 29.5 (L) 09/19/2017   MCV 88.0 09/19/2017   PLT 210 09/19/2017     STUDIES: No results found.  ASSESSMENT: Left breast DCIS.  PLAN:    1. Left breast DCIS: Patient underwent lumpectomy on January 02, 2017. Final pathology did not reveal an invasive component.  Patient completed adjuvant XRT on March 21, 2017.  Her most recent mammogram on December 01, 2020 was reported BI-RADS 1.  Repeat in August 23.  Patient initially  was to complete tamoxifen in December 2023, but since she has not taken any treatment for over a year, have recommended continuing treatment through December 2024.  Patient was given a refill for tamoxifen today.  Return to clinic in 6 months for routine evaluation.   2.  Family history: Patient reports her sister had breast cancer at the age of 40 and was treated at [facility]. By report, her genetic testing was negative.  Patient's genetic testing is negative as well. 3.  Shoulder/torso pain: Continue treatment per pain clinic.  I spent a total of 20 minutes reviewing chart data, face-to-face evaluation with the patient, counseling and coordination of care as detailed above.   The entire visit was done in the presence of an interpreter.  Patient expressed understanding and was in agreement with this plan. She also understands that She can call clinic at any time with any questions, concerns, or complaints.    Cancer Staging  Ductal carcinoma in situ (DCIS) of left breast Staging form: Breast, AJCC 8th Edition - Clinical stage from 12/15/2016: Stage 0 (cTis (DCIS), cN0, cM0,  ER+, PR+, HER2-) - Signed by Lloyd Huger, MD on 12/15/2016 Nuclear grade: G2 - Pathologic stage from 01/09/2017: Stage 0 (pTis (DCIS), pN0, cM0, ER+, PR+, HER2-) - Signed by Lloyd Huger, MD on 01/09/2017 Neoadjuvant therapy: No Nuclear grade: G2 Laterality: Left   Lloyd Huger, MD   06/18/2021 9:17 AM

## 2021-06-23 ENCOUNTER — Encounter: Payer: Self-pay | Admitting: Oncology

## 2021-06-23 ENCOUNTER — Other Ambulatory Visit: Payer: Self-pay

## 2021-06-23 ENCOUNTER — Inpatient Hospital Stay: Payer: Medicaid Other | Attending: Oncology | Admitting: Oncology

## 2021-06-23 VITALS — BP 136/104 | HR 84 | Temp 97.2°F | Resp 16 | Ht 62.0 in | Wt 155.6 lb

## 2021-06-23 DIAGNOSIS — D0512 Intraductal carcinoma in situ of left breast: Secondary | ICD-10-CM | POA: Insufficient documentation

## 2021-06-23 DIAGNOSIS — N644 Mastodynia: Secondary | ICD-10-CM | POA: Insufficient documentation

## 2021-06-23 DIAGNOSIS — Z833 Family history of diabetes mellitus: Secondary | ICD-10-CM | POA: Insufficient documentation

## 2021-06-23 DIAGNOSIS — R109 Unspecified abdominal pain: Secondary | ICD-10-CM | POA: Insufficient documentation

## 2021-06-23 DIAGNOSIS — Z86018 Personal history of other benign neoplasm: Secondary | ICD-10-CM | POA: Insufficient documentation

## 2021-06-23 DIAGNOSIS — Z803 Family history of malignant neoplasm of breast: Secondary | ICD-10-CM | POA: Insufficient documentation

## 2021-06-23 DIAGNOSIS — M2550 Pain in unspecified joint: Secondary | ICD-10-CM | POA: Insufficient documentation

## 2021-06-23 DIAGNOSIS — Z79899 Other long term (current) drug therapy: Secondary | ICD-10-CM | POA: Insufficient documentation

## 2021-06-23 DIAGNOSIS — Z923 Personal history of irradiation: Secondary | ICD-10-CM | POA: Insufficient documentation

## 2021-06-23 DIAGNOSIS — Z9079 Acquired absence of other genital organ(s): Secondary | ICD-10-CM | POA: Insufficient documentation

## 2021-06-23 DIAGNOSIS — M549 Dorsalgia, unspecified: Secondary | ICD-10-CM | POA: Insufficient documentation

## 2021-06-23 DIAGNOSIS — M25519 Pain in unspecified shoulder: Secondary | ICD-10-CM | POA: Insufficient documentation

## 2021-06-23 MED ORDER — TAMOXIFEN CITRATE 20 MG PO TABS: 20.0000 mg | ORAL_TABLET | Freq: Every day | ORAL | 3 refills | Status: DC

## 2021-06-23 NOTE — Progress Notes (Signed)
pt states that she has been having lots of pain on her rt breast ever since her surgery; has tried various painkillers and has not worked, a couple of weeks ago they administered a pain inj to block the nerve but still in pain. Currently on amitriptyline '25mg'$  to help with the pain. At her last visit, her tamoxifen was not picked up will like to know if the rx can be sent once again for pick up. Pt has noticed about a month ago that her rt breast feels larger,somewhat painful, but not hard to the touch.  ?

## 2021-07-09 ENCOUNTER — Ambulatory Visit
Admission: RE | Admit: 2021-07-09 | Discharge: 2021-07-09 | Disposition: A | Discharge status: Home / Self Care | Payer: Medicaid Other | Source: Ambulatory Visit | Attending: Oncology | Admitting: Oncology

## 2021-07-09 DIAGNOSIS — D0512 Intraductal carcinoma in situ of left breast: Secondary | ICD-10-CM | POA: Insufficient documentation

## 2021-09-20 ENCOUNTER — Other Ambulatory Visit: Payer: Self-pay

## 2021-09-20 DIAGNOSIS — D0512 Intraductal carcinoma in situ of left breast: Secondary | ICD-10-CM

## 2021-09-20 DIAGNOSIS — N6310 Unspecified lump in the right breast, unspecified quadrant: Secondary | ICD-10-CM

## 2021-10-12 ENCOUNTER — Emergency Department: Payer: BLUE CROSS/BLUE SHIELD

## 2021-10-12 ENCOUNTER — Other Ambulatory Visit: Payer: Self-pay

## 2021-10-12 ENCOUNTER — Emergency Department
Admission: EM | Admit: 2021-10-12 | Discharge: 2021-10-12 | Disposition: A | Discharge status: Home / Self Care | Payer: BLUE CROSS/BLUE SHIELD | Attending: Emergency Medicine | Admitting: Emergency Medicine

## 2021-10-12 DIAGNOSIS — Z853 Personal history of malignant neoplasm of breast: Secondary | ICD-10-CM | POA: Insufficient documentation

## 2021-10-12 DIAGNOSIS — R1030 Lower abdominal pain, unspecified: Secondary | ICD-10-CM | POA: Diagnosis present

## 2021-10-12 DIAGNOSIS — N3090 Cystitis, unspecified without hematuria: Secondary | ICD-10-CM | POA: Diagnosis not present

## 2021-10-12 DIAGNOSIS — D72829 Elevated white blood cell count, unspecified: Secondary | ICD-10-CM | POA: Diagnosis not present

## 2021-10-12 LAB — URINALYSIS, ROUTINE W REFLEX MICROSCOPIC
Bilirubin Urine: NEGATIVE
Glucose, UA: NEGATIVE mg/dL
Ketones, ur: 20 mg/dL — AB
Nitrite: POSITIVE — AB
Protein, ur: 100 mg/dL — AB
RBC / HPF: >50 RBC/hpf — ABNORMAL HIGH (ref 0–5)
Specific Gravity, Urine: 1.015 (ref 1.005–1.030)
WBC, UA: >50 WBC/hpf — ABNORMAL HIGH (ref 0–5)
pH: 6 (ref 5.0–8.0)

## 2021-10-12 LAB — CBC
HCT: 42.2 % (ref 36.0–46.0)
Hemoglobin: 13.8 g/dL (ref 12.0–15.0)
MCH: 28.3 pg (ref 26.0–34.0)
MCHC: 32.7 g/dL (ref 30.0–36.0)
MCV: 86.5 fL (ref 80.0–100.0)
Platelets: 300 10*3/uL (ref 150–400)
RBC: 4.88 MIL/uL (ref 3.87–5.11)
RDW: 13.6 % (ref 11.5–15.5)
WBC: 18.5 10*3/uL — ABNORMAL HIGH (ref 4.0–10.5)
nRBC: 0 % (ref 0.0–0.2)

## 2021-10-12 LAB — BASIC METABOLIC PANEL
Anion gap: 7 (ref 5–15)
BUN: 9 mg/dL (ref 6–20)
CO2: 25 mmol/L (ref 22–32)
Calcium: 9.3 mg/dL (ref 8.9–10.3)
Chloride: 105 mmol/L (ref 98–111)
Creatinine, Ser: 0.71 mg/dL (ref 0.44–1.00)
GFR, Estimated: >60 mL/min (ref >60)
Glucose, Bld: 124 mg/dL — ABNORMAL HIGH (ref 70–99)
Potassium: 4.1 mmol/L (ref 3.5–5.1)
Sodium: 137 mmol/L (ref 135–145)

## 2021-10-12 MED ORDER — CEPHALEXIN 500 MG PO CAPS
500.0000 mg | ORAL_CAPSULE | Freq: Once | ORAL | Status: AC
  Administered 2021-10-12: 500 mg via ORAL
  Filled 2021-10-12: qty 1

## 2021-10-12 MED ORDER — CEPHALEXIN 500 MG PO CAPS: 500.0000 mg | ORAL_CAPSULE | Freq: Four times a day (QID) | ORAL | 0 refills | Status: AC

## 2021-10-12 MED ORDER — KETOROLAC TROMETHAMINE 30 MG/ML IJ SOLN
30.0000 mg | Freq: Once | INTRAMUSCULAR | Status: AC
Start: 2021-10-12 — End: 2021-10-12
  Administered 2021-10-12: 30 mg via INTRAMUSCULAR
  Filled 2021-10-12: qty 1

## 2021-10-12 NOTE — ED Notes (Signed)
See triage note  Presents with flank and lower abd pain  sxs' started yesterday  States she had fever at home but is currently afebrile

## 2021-10-12 NOTE — ED Triage Notes (Signed)
Pt to ED for bilateral flank pain, burning with urination that started last night. Denies hx kidney stones. Hx UTI.  +hematuria

## 2021-10-12 NOTE — ED Notes (Signed)
First Nurse Note: Pt to ED via POV for abdominal pain. Pt is in NAD.

## 2021-10-12 NOTE — ED Provider Notes (Signed)
[facility] Provider Note    Event Date/Time   First MD Initiated Contact with Patient 10/12/21 1108     (approximate)   History   Flank Pain   HPI  Sherry Short is a 46 y.o. female with a history of DCIS of left breast, migraine, hyperlipidemia, and prediabetes who presents with lower abdominal pain over the last 1 to 2 days, crampy, associated with bilateral low back and flank pain as well as difficulty urinating yesterday.  The patient states that today she has been able to urinate but it has been painful and she saw some blood in her urine.  She denies any vaginal bleeding or discharge.  She has no nausea or vomiting.  However, she did have some chills.  She states that she has had UTIs in the past.      Physical Exam   Triage [place] Signs: ED Triage Vitals [10/12/21 1040]  Enc Vitals Group     BP (!) 143/92     Pulse Rate 89     Resp 18     Temp 98.4 F (36.9 C)     Temp src      SpO2 96 %     Weight 157 lb (71.2 kg)     Height '5\' 1"'$  (1.549 m)     Head Circumference      Peak Flow      Pain Score 10     Pain Loc      Pain Edu?      Excl. in [place]?     Most recent [place] signs: Vitals:   10/12/21 1040  BP: (!) 143/92  Pulse: 89  Resp: 18  Temp: 98.4 F (36.9 C)  SpO2: 96%     General: Well-appearing, no distress. CV:  Good peripheral perfusion.  Resp:  Normal effort.  Abd:  Soft with mild diffuse tenderness.  No distention.  Other:  Mild left CVA tenderness.   ED Results / Procedures / Treatments   Labs (all labs ordered are listed, but only abnormal results are displayed) Labs Reviewed  URINALYSIS, ROUTINE W REFLEX MICROSCOPIC - Abnormal; Notable for the following components:      Result Value   Color, Urine AMBER (*)    APPearance CLOUDY (*)    Hgb urine dipstick MODERATE (*)    Ketones, ur 20 (*)    Protein, ur 100 (*)    Nitrite POSITIVE (*)    Leukocytes,Ua MODERATE (*)    RBC / HPF >50 (*)    WBC, UA >50  (*)    Bacteria, UA RARE (*)    All other components within normal limits  BASIC METABOLIC PANEL - Abnormal; Notable for the following components:   Glucose, Bld 124 (*)    All other components within normal limits  CBC - Abnormal; Notable for the following components:   WBC 18.5 (*)    All other components within normal limits  POC URINE PREG, ED     EKG   RADIOLOGY  CT abdomen/pelvis: I independently viewed and interpreted the images.  There are no ureteral stones or evidence of hydronephrosis.  Radiology read indicates findings consistent with cystitis.  PROCEDURES:  Critical Care performed: No  Procedures   MEDICATIONS ORDERED IN ED: Medications  cephALEXin (KEFLEX) capsule 500 mg (has no administration in time range)  ketorolac (TORADOL) 30 MG/ML injection 30 mg (30 mg Intramuscular Given 10/12/21 1123)     IMPRESSION / MDM / ASSESSMENT AND  PLAN / ED COURSE  I reviewed the triage [place] signs and the nursing notes.  46 year old female with PMH as noted above presents with suprapubic and bilateral flank pain along with dysuria and hematuria.  I reviewed the past medical records.  The patient has DCIS of the left breast and follows with the cancer center.  She was last seen there on 3/15.  She has no recent ED visits or admissions.  On exam the patient is well-appearing.  Her [place] signs are normal.  The abdomen is soft with mild diffuse discomfort but no focal tenderness or peritoneal signs.  She has mild right CVA tenderness.  Differential diagnosis includes, but is not limited to, cystitis, pyelonephritis, ureteral stone, dysmenorrhea.  However, the patient denies any vaginal bleeding or discharge.  She has no focal abdominal tenderness.  Urinalysis shows nitrates and other findings consistent with UTI.  Creatinine is normal.  WBC count is elevated.  Patient's presentation is most consistent with acute presentation with potential threat to life or bodily  function.  We will obtain a noncontrast CT to evaluate for ureteral stone and give IM Toradol for pain control.  ----------------------------------------- 12:23 PM on 10/12/2021 -----------------------------------------  He is consistent with cystitis.  There are no stones or evidence of pyelonephritis.  The patient is feeling better after Toradol.  She is stable for discharge home.  I will put her on a 1 week course of Keflex.  Return precautions given, and she expresses understanding.   FINAL CLINICAL IMPRESSION(S) / ED DIAGNOSES   Final diagnoses:  Cystitis     Rx / DC Orders   ED Discharge Orders          Ordered    cephALEXin (KEFLEX) 500 MG capsule  4 times daily        10/12/21 1222             Note:  This document was prepared using Dragon voice recognition software and may include unintentional dictation errors.    Arta Silence, MD 10/12/21 1224

## 2021-12-06 ENCOUNTER — Ambulatory Visit
Admission: RE | Admit: 2021-12-06 | Discharge: 2021-12-06 | Disposition: A | Discharge status: Home / Self Care | Payer: BLUE CROSS/BLUE SHIELD | Source: Ambulatory Visit | Attending: Surgery | Admitting: Surgery

## 2021-12-06 DIAGNOSIS — N6310 Unspecified lump in the right breast, unspecified quadrant: Secondary | ICD-10-CM | POA: Insufficient documentation

## 2021-12-06 DIAGNOSIS — D0512 Intraductal carcinoma in situ of left breast: Secondary | ICD-10-CM

## 2021-12-15 ENCOUNTER — Ambulatory Visit: Payer: BLUE CROSS/BLUE SHIELD | Admitting: Surgery

## 2021-12-24 ENCOUNTER — Inpatient Hospital Stay: Payer: BLUE CROSS/BLUE SHIELD | Attending: Internal Medicine | Admitting: Medical Oncology

## 2021-12-24 ENCOUNTER — Encounter: Payer: Self-pay | Admitting: Medical Oncology

## 2021-12-24 VITALS — BP 116/84 | HR 74 | Temp 98.2°F | Ht 61.0 in | Wt 155.0 lb

## 2021-12-24 DIAGNOSIS — Z9079 Acquired absence of other genital organ(s): Secondary | ICD-10-CM | POA: Insufficient documentation

## 2021-12-24 DIAGNOSIS — N6489 Other specified disorders of breast: Secondary | ICD-10-CM | POA: Insufficient documentation

## 2021-12-24 DIAGNOSIS — M79603 Pain in arm, unspecified: Secondary | ICD-10-CM | POA: Diagnosis not present

## 2021-12-24 DIAGNOSIS — N644 Mastodynia: Secondary | ICD-10-CM | POA: Insufficient documentation

## 2021-12-24 DIAGNOSIS — Z803 Family history of malignant neoplasm of breast: Secondary | ICD-10-CM | POA: Diagnosis not present

## 2021-12-24 DIAGNOSIS — D0512 Intraductal carcinoma in situ of left breast: Secondary | ICD-10-CM | POA: Insufficient documentation

## 2021-12-24 DIAGNOSIS — Z79899 Other long term (current) drug therapy: Secondary | ICD-10-CM | POA: Diagnosis not present

## 2021-12-24 DIAGNOSIS — R61 Generalized hyperhidrosis: Secondary | ICD-10-CM | POA: Insufficient documentation

## 2021-12-24 DIAGNOSIS — Z833 Family history of diabetes mellitus: Secondary | ICD-10-CM | POA: Diagnosis not present

## 2021-12-24 DIAGNOSIS — Z86018 Personal history of other benign neoplasm: Secondary | ICD-10-CM | POA: Diagnosis not present

## 2021-12-24 DIAGNOSIS — M25519 Pain in unspecified shoulder: Secondary | ICD-10-CM | POA: Diagnosis not present

## 2021-12-24 DIAGNOSIS — Z923 Personal history of irradiation: Secondary | ICD-10-CM | POA: Insufficient documentation

## 2021-12-24 DIAGNOSIS — R109 Unspecified abdominal pain: Secondary | ICD-10-CM | POA: Diagnosis not present

## 2021-12-24 MED ORDER — TAMOXIFEN CITRATE 20 MG PO TABS: 20.0000 mg | ORAL_TABLET | Freq: Every day | ORAL | 3 refills | Status: DC

## 2021-12-24 NOTE — Progress Notes (Signed)
[place]  Telephone:([phone]) [phone] Fax:(336) [phone]  ID: Sherry Short OB: 1976-01-18  MR#: [id number]  QDI#:[id number]  Patient Care Team: Pcp, No as PCP - General  CHIEF COMPLAINT: Left breast DCIS.   INTERVAL HISTORY: Patient returns to clinic today for routine 70-monthevaluation.  She is accompanied by professional interpretation services. Patient states that she continues to have left breast pain. Slightly worse compared to her last visit 6 months ago. She has not noted any other changes such as skin changes, nipple discharge, unintentional weight loss. She does report chronic night sweats. She is UTD on cancer screenings such as colonoscopy per patient. She reports that she has not started her Tamoxifen yet- states that she has not picked this up as she was not sure if her pharmacy had the medication. She continues to be followed by a pain specialist for her breast, shoulder, arm pain. Being referred to [facility] by local pain specialist for second opinion after she failed nerve block procedure. She has no neurologic complaints. She denies any recent fevers or illnesses. She has a good appetite and denies weight loss. She denies any other pain. She has no chest pain or shortness of breath. She denies any nausea, vomiting, constipation, or diarrhea. She has no urinary complaints.  Patient offers no further specific complaints today.  REVIEW OF SYSTEMS:   Review of Systems  Constitutional: Negative.  Negative for fever, malaise/fatigue and weight loss.  Respiratory: Negative.  Negative for cough and shortness of breath.   Cardiovascular: Negative.  Negative for chest pain and leg swelling.  Gastrointestinal: Negative.  Negative for abdominal pain.  Genitourinary:  Positive for flank pain. Negative for dysuria.  Musculoskeletal:  Positive for back pain and joint pain.  Skin: Negative.  Negative for rash.  Neurological: Negative.  Negative for dizziness, sensory change,  weakness and headaches.  Psychiatric/Behavioral: Negative.  The patient is not nervous/anxious.     As per HPI. Otherwise, a complete review of systems is negative.  PAST MEDICAL HISTORY: Past Medical History:  Diagnosis Date   Anginal pain ([place] /   chest pain probably related to anxiety, per pt.   Anxiety    [place] 12/13[place]   Left Breast (DCIS)   Migraine headache 08/2017   tylenol and pain medicine helps this   Personal history of radiation therapy 2018   left breast DCIS   Seasonal allergies     PAST SURGICAL HISTORY: Past Surgical History:  Procedure Laterality Date   ABDOMINAL HYSTERECTOMY     BREAST BIOPSY Left 11/30/2016   left breast calcs LIQ high grade DCIS.  AND u[place]oreabx 1:00 10 cmfn fibroadenoma and phyllodes tumor   BREAST BIOPSY Left 12/13/2016   Left Affrim Bx central calcs,  Ususal ductal hyperplasia   BREAST BIOPSY Left 12/13/2016   Left Affirm Bx- LIQ calcs, columnar cell change, disconcordant and excision recommened.    BREAST BIOPSY Left 06/01/2017   Left Affirm- of LIQ calcs, re bx of area biopsied in 12/2016 that was disconcordant, epithelial changes, CALCIFICATION ASSOCIATED WITH RADIATION EFFECT and fibroadenomatoid.    BREAST LUMPECTOMY Left 01/02/2017   DCIS and fibroadenoma with phyllodes excisied. SN bx, LN negative   BREAST LUMPECTOMY WITH SENTINEL LYMPH NODE BIOPSY Left 01/02/2017   Procedure: BREAST LUMPECTOMY WITH SENTINEL LYMPH NODE BX;  Surgeon: WClayburn Pert MD;  Location: [facility] ORS;  Service: General;  Laterality: Left;   LAPAROSCOPIC OVARIAN CYSTECTOMY Left 09/18/2017   Procedure: LAPAROSCOPIC OVARIAN CYSTECTOMY (x 2);  Surgeon: Schermerhorn, Gwen Her, MD;  Location: [facility] ORS;  Service: Gynecology;  Laterality: Left;   LAPAROSCOPIC VAGINAL HYSTERECTOMY WITH SALPINGECTOMY Bilateral 09/18/2017   Procedure: LAPAROSCOPIC ASSISTED VAGINAL HYSTERECTOMY WITH SALPINGECTOMY;  Surgeon: Schermerhorn, Gwen Her, MD;  Location: [facility] ORS;   Service: Gynecology;  Laterality: Bilateral;   TUBAL LIGATION      FAMILY HISTORY: Family History  Problem Relation Age of Onset   Breast cancer Sister 48   Healthy Mother    Diabetes Father     ADVANCED DIRECTIVES (Y/N):  N  HEALTH MAINTENANCE: Social History   Tobacco Use   Smoking status: Never   Smokeless tobacco: Never  Vaping Use   Vaping Use: Never used  Substance Use Topics   Alcohol use: No   Drug use: No     Colonoscopy:  PAP:  Bone density:  Lipid panel:  No Known Allergies  Current Outpatient Medications  Medication Sig Dispense Refill   amitriptyline (ELAVIL) 25 MG tablet Take 1 tablet (25 mg total) by mouth at bedtime. 30 tablet 2   tamoxifen (NOLVADEX) 20 MG tablet Take 1 tablet (20 mg total) by mouth daily. 90 tablet 3   No current facility-administered medications for this visit.    OBJECTIVE: Vitals:   12/24/21 1016  BP: 116/84  Pulse: 74  Temp: 98.2 F (36.8 C)     Body mass index is 29.29 kg/m.    ECOG FS:0 - Asymptomatic  General: Well-developed, well-nourished, no acute distress. Eyes: Pink conjunctiva, anicteric sclera. HEENT: Normocephalic, moist mucous membranes. Lungs: No audible wheezing or coughing. Heart: Regular rate and rhythm. Musculoskeletal: No edema, cyanosis, or clubbing. Neuro: Alert, answering all questions appropriately. Cranial nerves grossly intact. Skin: No rashes or petechiae noted. Psych: Normal affect. Breast: Right breast without palpable abnormalities and no nipple discharge or skin texture changes. The left breast with tenderness throughout. There is a smooth superficial round lesion of the left breast at 1 o'clock in areola with mild tenderness. Well healed surgical scars of left breast. No axillary adenopathy bilaterally.   LAB RESULTS:  Lab Results  Component Value Date   NA 137 10/12/2021   K 4.1 10/12/2021   CL 105 10/12/2021   CO2 25 10/12/2021   GLUCOSE 124 (H) 10/12/2021   BUN 9 10/12/2021    CREATININE 0.71 10/12/2021   CALCIUM 9.3 10/12/2021   PROT 7.5 08/09/2015   ALBUMIN 4.3 08/09/2015   AST 18 08/09/2015   ALT 13 (L) 08/09/2015   ALKPHOS 68 08/09/2015   BILITOT 0.7 08/09/2015   GFRNONAA >60 10/12/2021   GFRAA >60 09/19/2017    Lab Results  Component Value Date   WBC 18.5 (H) 10/12/2021   HGB 13.8 10/12/2021   HCT 42.2 10/12/2021   MCV 86.5 10/12/2021   PLT 300 10/12/2021     STUDIES: MM DIAG BREAST TOMO BILATERAL  Result Date: 12/06/2021 CLINICAL DATA:  Patient for follow-up of probably benign right breast mass EXAM: DIGITAL DIAGNOSTIC BILATERAL MAMMOGRAM WITH TOMOSYNTHESIS; ULTRASOUND RIGHT BREAST LIMITED TECHNIQUE: Bilateral digital diagnostic mammography and breast tomosynthesis was performed.; Targeted ultrasound examination of the right breast was performed COMPARISON:  Previous exam(s). ACR Breast Density Category c: The breast tissue is heterogeneously dense, which may obscure small masses. FINDINGS: No concerning masses, calcifications or distortion identified within either breast. Asymmetry within the right breast demonstrated best on the MLO view is less conspicuous. Targeted ultrasound is performed, showing interval resolution of previously described mass within the right breast 10 o'clock position 6 cm  from the nipple. IMPRESSION: No mammographic evidence for malignancy. RECOMMENDATION: Annual screening mammography 1 year. I have discussed the findings and recommendations with the patient. If applicable, a reminder letter will be sent to the patient regarding the next appointment. BI-RADS CATEGORY  2: Benign. Electronically Signed   By: Lovey Newcomer M.D.   On: 12/06/2021 14:23  US BREAST LTD UNI RIGHT INC AXILLA  Result Date: 12/06/2021 CLINICAL DATA:  Patient for follow-up of probably benign right breast mass EXAM: DIGITAL DIAGNOSTIC BILATERAL MAMMOGRAM WITH TOMOSYNTHESIS; ULTRASOUND RIGHT BREAST LIMITED TECHNIQUE: Bilateral digital diagnostic mammography  and breast tomosynthesis was performed.; Targeted ultrasound examination of the right breast was performed COMPARISON:  Previous exam(s). ACR Breast Density Category c: The breast tissue is heterogeneously dense, which may obscure small masses. FINDINGS: No concerning masses, calcifications or distortion identified within either breast. Asymmetry within the right breast demonstrated best on the MLO view is less conspicuous. Targeted ultrasound is performed, showing interval resolution of previously described mass within the right breast 10 o'clock position 6 cm from the nipple. IMPRESSION: No mammographic evidence for malignancy. RECOMMENDATION: Annual screening mammography 1 year. I have discussed the findings and recommendations with the patient. If applicable, a reminder letter will be sent to the patient regarding the next appointment. BI-RADS CATEGORY  2: Benign. Electronically Signed   By: Lovey Newcomer M.D.   On: 12/06/2021 14:23  Encounter Diagnoses  Name Primary?   Ductal carcinoma in situ (DCIS) of left breast Yes   Breast pain    Night sweats     ASSESSMENT: Left breast DCIS.  PLAN:    1. Left breast DCIS: Patient underwent lumpectomy on January 02, 2017. Final pathology did not reveal an invasive component.  Patient completed adjuvant XRT on March 21, 2017.  Her most recent mammogram on December 06, 2021 was reported BI-RADS 2.  Given her worsening symptoms and night sweats will order breast MRI. Pt agreeable. Continue with pain specialist. RTC 6 months for continued follow up. Also had a discussion of the importance of her starting her Tamoxifen. Resending RX to pharmacy.  2.  Family history: Patient reports her sister had breast cancer at the age of 71 and was treated at [facility]. By report, her genetic testing was negative.  Patient's genetic testing is negative as well. 3.  Shoulder/torso pain: Chronic and unchanged.  Continue treatment per pain clinic.  I spent a total of 20 minutes  reviewing chart data, face-to-face evaluation with the patient, counseling and coordination of care as detailed above.  The entire visit was done in the presence of an interpreter.  Patient expressed understanding and was in agreement with this plan. She also understands that She can call clinic at any time with any questions, concerns, or complaints.    Cancer Staging  Ductal carcinoma in situ (DCIS) of left breast Staging form: Breast, AJCC 8th Edition - Clinical stage from 12/15/2016: Stage 0 (cTis (DCIS), cN0, cM0, ER+, PR+, HER2-) - Signed by Lloyd Huger, MD on 12/15/2016 Nuclear grade: G2 - Pathologic stage from 01/09/2017: Stage 0 (pTis (DCIS), pN0, cM0, ER+, PR+, HER2-) - Signed by Lloyd Huger, MD on 01/09/2017 Neoadjuvant therapy: No Nuclear grade: G2 Laterality: Left   Hughie Closs, PA-C   12/24/2021 11:01 AM

## 2021-12-27 ENCOUNTER — Other Ambulatory Visit: Payer: Self-pay | Admitting: Medical Oncology

## 2021-12-27 DIAGNOSIS — D0512 Intraductal carcinoma in situ of left breast: Secondary | ICD-10-CM

## 2021-12-27 DIAGNOSIS — N644 Mastodynia: Secondary | ICD-10-CM

## 2021-12-27 DIAGNOSIS — R61 Generalized hyperhidrosis: Secondary | ICD-10-CM

## 2021-12-29 ENCOUNTER — Encounter: Payer: Self-pay | Admitting: Surgery

## 2021-12-29 ENCOUNTER — Ambulatory Visit (INDEPENDENT_AMBULATORY_CARE_PROVIDER_SITE_OTHER): Payer: BLUE CROSS/BLUE SHIELD | Admitting: Surgery

## 2021-12-29 ENCOUNTER — Other Ambulatory Visit: Payer: Self-pay

## 2021-12-29 VITALS — BP 123/86 | HR 76 | Temp 98.2°F | Ht 62.0 in | Wt 158.0 lb

## 2021-12-29 DIAGNOSIS — D0512 Intraductal carcinoma in situ of left breast: Secondary | ICD-10-CM

## 2021-12-29 DIAGNOSIS — N644 Mastodynia: Secondary | ICD-10-CM

## 2021-12-29 NOTE — Patient Instructions (Addendum)
Please follow up with future mammograms and breast exams with your primary care physician.    Autoexamen de [employer] Breast Self-Awareness El autoexamen de mamas es para Civil engineer, contracting la apariencia y la sensibilidad de las [place]. Es necesario que respete estas indicaciones: Controle sus Engineer, agricultural. Informe al mdico si hay algn cambio. Familiarcese con el aspecto y con la forma en que se sienten sus mamas al palparlas. Esto puede ayudarle a Educational psychologist en las mamas mientras todava es pequeo y puede tratarse. Debe hacerse un autoexamen de mamas incluso si tiene implantes mamarios. Lo que necesita: Un espejo. Una habitacin bien iluminada. Una almohada u otro objeto blando. Cmo realizar el autoexamen de mamas Siga estos pasos para Optometrist un autoexamen de mamas: Busque cambios  Qutese toda la ropa por encima de la cintura. Prese frente a un espejo en una habitacin con buena iluminacin. [place] a los costados. Compare las mamas en el espejo. Busque diferencias entre ellas, por ejemplo: Una diferencia en la forma. Una diferencia en el tamao. Arrugas, depresiones y protuberancias en [place] y no en la otra. Observe cada mama para buscar cambios en la piel, por ejemplo: Enrojecimiento. Zonas escamosas. Piel que se ha engrosado. Hoyuelos. Llagas abiertas (lceras). Observe si hay cambios en los pezones, por ejemplo: Lquido que sale de un pezn. Lquido alrededor de Pension scheme manager. Sangrado. Hoyuelos. Enrojecimiento. Un pezn que parece metido hacia adentro (retrado) o que ha cambiado de posicin. [place] si hay cambios [place]ustese boca arriba. Plpese cada mama. Para hacer esto: Elija una mama para palpar. Coloque una almohada debajo del hombro ms cercano a esa mama. Coloque el brazo ms cercano a esa mama por detrs de la cabeza. Plpese la zona del pezn de esa mama con la mano del otro brazo. Plpese la zona con las yemas de los tres dedos del medio y  haga crculos con los dedos. Utilice una presin ligera, media y [employer]. Contine superponiendo crculos, movindose hacia abajo por encima de la mama. Contine haciendo crculos con los dedos. Detngase cuando sienta las costillas. Empiece a hacer crculos con los dedos nuevamente, esta vez haciendo movimientos ascendentes Librarian, academic a Teacher, early years/pre. Luego, haga crculos hacia afuera, de un lado a otro de la mama y [employer] zona de la axila. Apriete el pezn. Compruebe si hay secrecin y bultos. Repita estos pasos para examinar la otra mama. Sintese o prese en la ducha o la baera. Con agua jabonosa en la piel, plpese cada mama del mismo modo que lo hizo [place]. Anote sus hallazgos Anotar lo que encuentra puede ayudarla a recordar qu contarle al mdico. [place] los siguientes datos: Qu es normal para cada mama. Cualquier cambio que haya encontrado en cada mama. Estos incluyen: La clase de cambios que encuentra. Dolor o sensibilidad en la mama. Cualquier bulto que encuentre. Anote el tamao y dnde se encuentra. Cundo tuvo su ltimo perodo (ciclo menstrual). Consejos generales Si est amamantando, el mejor momento para el examen de las mamas es despus de darle de Administrator, arts al beb o despus de usar un sacaleches. Si tiene [place] [employer], el mejor momento para examinarse las [place] es de 5 a 7 das despus de finalizado su ciclo mensual. Con el [place], se sentir cmoda con el autoexamen. Tambin comenzar a saber si hay cambios en sus mamas. Comunquese con un mdico si: Observa un cambio en la forma o el tamao de las mamas o los pezones. Observa un cambio en la piel de  las [employer] o los pezones, como la piel enrojecida o escamosa. Tiene secrecin de lquido proveniente de los pezones que no es normal. [place] un nuevo bulto o una zona engrosada. Tiene dolor de [place]. Tiene alguna inquietud [employer] de [place]. Resumen El autoexamen de mamas incluye  buscar cambios en las mamas y palpar para Hydrographic surveyor cualquier cambio en las [place]. Debe hacer el autoexamen de mamas frente a un espejo en una habitacin bien iluminada. Si tiene perodos (Agricultural engineer), el mejor momento para examinarse las [place] es de 5 a 7 das despus de finalizado el perodo menstrual. Informe al mdico cualquier cambio que vea en sus mamas. Los cambios incluyen cambios en el tamao, cambios en la piel, Social research officer, government o sensibilidad, o lquido que sale de los pezones que no es normal. Esta informacin no tiene Marine scientist el consejo del mdico. Asegrese de hacerle al mdico cualquier pregunta que tenga. Document Revised: 02/17/2021 Document Reviewed: 02/17/2021 Elsevier Patient Education  [place].

## 2021-12-30 NOTE — Progress Notes (Signed)
12/29/2021  History of Present Illness: Sherry Short is a 46 y.o. female status post left breast lumpectomy for DCIS in September 2018 with Dr. Adonis Huguenin followed by left breast excisional biopsy with him in February 2019 for fibroadenomatoid changes.  The patient has been having left breast pain since her surgery and radiation and more recently she is reporting also pain in the right breast.  She has been seen at [facility] and is currently being followed by Dr. Grayland Ormond with oncology for tamoxifen for her breast pain and for ER/PR positive status.  Patient reports that she still having breast pain issues and more recently she was unable to pick up her prescription and so a new prescription was sent.  She has not picked it up yet but I stressed to her the importance of using the medication.  She had bilateral mammogram on 11/28/2021 which did not show any evidence of malignancy.  Past Medical History: Past Medical History:  Diagnosis Date   Anginal pain ([place]) /   chest pain probably related to anxiety, per pt.   Anxiety    Cancer ([place]) 12/13/2016   Left Breast (DCIS)   Migraine headache 08/2017   tylenol and pain medicine helps this   Personal history of radiation therapy 2018   left breast DCIS   Seasonal allergies      Past Surgical History: Past Surgical History:  Procedure Laterality Date   ABDOMINAL HYSTERECTOMY     BREAST BIOPSY Left 11/30/2016   left breast calcs LIQ high grade DCIS.  AND [place] bx 1:00 10 cmfn fibroadenoma and phyllodes tumor   BREAST BIOPSY Left 12/13/2016   Left Affrim Bx central calcs,  Ususal ductal hyperplasia   BREAST BIOPSY Left 12/13/2016   Left Affirm Bx- LIQ calcs, columnar cell change, disconcordant and excision recommened.    BREAST BIOPSY Left 06/01/2017   Left Affirm- of LIQ calcs, re bx of area biopsied in 12/2016 that was disconcordant, epithelial changes, CALCIFICATION ASSOCIATED WITH RADIATION EFFECT and fibroadenomatoid.    BREAST LUMPECTOMY Left  01/02/2017   DCIS and fibroadenoma with phyllodes excisied. SN bx, LN negative   BREAST LUMPECTOMY WITH SENTINEL LYMPH NODE BIOPSY Left 01/02/2017   Procedure: BREAST LUMPECTOMY WITH SENTINEL LYMPH NODE BX;  Surgeon: Clayburn Pert, MD;  Location: [facility] ORS;  Service: General;  Laterality: Left;   LAPAROSCOPIC OVARIAN CYSTECTOMY Left 09/18/2017   Procedure: LAPAROSCOPIC OVARIAN CYSTECTOMY (x 2);  Surgeon: Schermerhorn, Gwen Her, MD;  Location: [facility] ORS;  Service: Gynecology;  Laterality: Left;   LAPAROSCOPIC VAGINAL HYSTERECTOMY WITH SALPINGECTOMY Bilateral 09/18/2017   Procedure: LAPAROSCOPIC ASSISTED VAGINAL HYSTERECTOMY WITH SALPINGECTOMY;  Surgeon: Schermerhorn, Gwen Her, MD;  Location: [facility] ORS;  Service: Gynecology;  Laterality: Bilateral;   TUBAL LIGATION      Home Medications: Prior to Admission medications   Medication Sig Start Date End Date Taking? Authorizing Provider  amitriptyline (ELAVIL) 25 MG tablet Take 1 tablet (25 mg total) by mouth at bedtime. 04/29/21  Yes Gillis Santa, MD  tamoxifen (NOLVADEX) 20 MG tablet Take 1 tablet (20 mg total) by mouth daily. 12/24/21  Yes C[place]ton, Sarah M, PA-C    Allergies: No Known Allergies  Review of Systems: Review of Systems  Constitutional:  Negative for chills and fever.  Respiratory:  Negative for shortness of breath.   Cardiovascular:  Negative for chest pain.  Gastrointestinal:  Negative for abdominal pain, nausea and vomiting.  Skin:        Bilateral breast pain    Physical Exam  BP 123/86   Pulse 76   Temp 98.2 F (36.8 C) (Oral)   Ht '5\' 2"'$  (1.575 m)   Wt 158 lb (71.7 kg)   LMP 09/04/2017 (Exact Date)   SpO2 98%   BMI 28.90 kg/m  CONSTITUTIONAL: No acute distress, well-nourished HEENT:  Normocephalic, atraumatic, extraocular motion intact. RESPIRATORY:  Normal respiratory effort without pathologic use of accessory muscles. CARDIOVASCULAR: Regular rhythm and rate. BREAST: Right breast without any palpable  masses, skin changes, or nipple changes.  She does have some tenderness to palpation in the lateral portion of the breast.  No axillary lymphadenopathy.  Left breast without any palpable masses, skin changes, or nipple changes.  She has 2 well-healed lumpectomy scars.  She does have some tenderness to palpation throughout the whole breast.  No left axillary lymphadenopathy. NEUROLOGIC:  Motor and sensation is grossly normal.  Cranial nerves are grossly intact. PSYCH:  Alert and oriented to person, place and time. Affect is normal.  Labs/Imaging: Bilateral breast mammogram and right ultrasound on 11/28/2021: FINDINGS: No concerning masses, calcifications or distortion identified within either breast. Asymmetry within the right breast demonstrated best on the MLO view is less conspicuous.   Targeted ultrasound is performed, showing interval resolution of previously described mass within the right breast 10 o'clock position 6 cm from the nipple.   IMPRESSION: No mammographic evidence for malignancy.   RECOMMENDATION: Annual screening mammography 1 year.   I have discussed the findings and recommendations with the patient. If applicable, a reminder letter will be sent to the patient regarding the next appointment.   BI-RADS CATEGORY  2: Benign.  Assessment and Plan: This is a 46 y.o. female status post left breast lumpectomy for DCIS in 2018, with bilateral breast pain.  - Stressed again to the patient the importance of trying the tamoxifen to see if this can help with her breast pain.  This will also have the benefit of risk reduction for breast cancer in the future given her ER/PR positive status from her DCIS.  She understands this. - Mammogram does not show any evidence of suspicious findings for her breast pain on either side.  Her exam today was also reassuring aside from the discomfort on either breast. - From a surgical standpoint, discussed with her that there is little that we  can do for breast pain and most likely this is can be residual from scarring and radiation changes particular in the left side.  Some of the pain on the right side could be from fibrocystic changes.  She is now 5 years out from her surgery for her DCIS at this point is appropriate to defer future exams and mammograms to the medical team or oncology team.  Discussed with her that we will be readily available to help if any new findings, up she can always reach [place] if there are any questions or concerns that she has. - Follow-up as needed.  I spent 30 minutes dedicated to the care of this patient on the date of this encounter to include pre-visit review of records, face-to-face time with the patient discussing diagnosis and management, and any post-visit coordination of care.   Melvyn Neth, [facility]

## 2021-12-31 ENCOUNTER — Encounter: Payer: Self-pay | Admitting: Surgery

## 2022-06-28 ENCOUNTER — Inpatient Hospital Stay: Payer: BLUE CROSS/BLUE SHIELD | Attending: Oncology | Admitting: Oncology

## 2022-12-06 ENCOUNTER — Encounter: Payer: Self-pay | Admitting: Family Medicine

## 2022-12-06 DIAGNOSIS — Z1231 Encounter for screening mammogram for malignant neoplasm of breast: Secondary | ICD-10-CM

## 2022-12-22 ENCOUNTER — Other Ambulatory Visit: Payer: Self-pay | Admitting: Family Medicine

## 2022-12-22 DIAGNOSIS — N644 Mastodynia: Secondary | ICD-10-CM

## 2023-01-18 ENCOUNTER — Inpatient Hospital Stay
Admission: RE | Admit: 2023-01-18 | Discharge: 2023-01-18 | Disposition: A | Discharge status: Home / Self Care | Payer: Medicaid Other | Source: Ambulatory Visit | Attending: Family Medicine | Admitting: Family Medicine

## 2023-01-18 ENCOUNTER — Ambulatory Visit
Admission: RE | Admit: 2023-01-18 | Discharge: 2023-01-18 | Disposition: A | Discharge status: Home / Self Care | Payer: Medicaid Other | Source: Ambulatory Visit | Attending: Family Medicine | Admitting: Family Medicine

## 2023-01-18 ENCOUNTER — Other Ambulatory Visit: Payer: Self-pay | Admitting: Family Medicine

## 2023-01-18 DIAGNOSIS — N644 Mastodynia: Secondary | ICD-10-CM

## 2023-12-07 ENCOUNTER — Other Ambulatory Visit: Payer: Self-pay | Admitting: Family Medicine

## 2023-12-07 DIAGNOSIS — Z1231 Encounter for screening mammogram for malignant neoplasm of breast: Secondary | ICD-10-CM

## 2023-12-24 ENCOUNTER — Emergency Department

## 2023-12-24 ENCOUNTER — Other Ambulatory Visit: Payer: Self-pay

## 2023-12-24 DIAGNOSIS — R1031 Right lower quadrant pain: Secondary | ICD-10-CM | POA: Diagnosis present

## 2023-12-24 DIAGNOSIS — Z853 Personal history of malignant neoplasm of breast: Secondary | ICD-10-CM | POA: Insufficient documentation

## 2023-12-24 DIAGNOSIS — K3580 Unspecified acute appendicitis: Secondary | ICD-10-CM | POA: Diagnosis not present

## 2023-12-24 LAB — COMPREHENSIVE METABOLIC PANEL WITH GFR
ALT: 16 U/L (ref 0–44)
AST: 20 U/L (ref 15–41)
Albumin: 4.1 g/dL (ref 3.5–5.0)
Alkaline Phosphatase: 85 U/L (ref 38–126)
Anion gap: 9 (ref 5–15)
BUN: 17 mg/dL (ref 6–20)
CO2: 22 mmol/L (ref 22–32)
Calcium: 8.8 mg/dL — ABNORMAL LOW (ref 8.9–10.3)
Chloride: 105 mmol/L (ref 98–111)
Creatinine, Ser: 0.76 mg/dL (ref 0.44–1.00)
GFR, Estimated: >60 mL/min (ref >60)
Glucose, Bld: 119 mg/dL — ABNORMAL HIGH (ref 70–99)
Potassium: 4 mmol/L (ref 3.5–5.1)
Sodium: 136 mmol/L (ref 135–145)
Total Bilirubin: 0.8 mg/dL (ref 0.0–1.2)
Total Protein: 7.6 g/dL (ref 6.5–8.1)

## 2023-12-24 LAB — CBC
HCT: 40.7 % (ref 36.0–46.0)
Hemoglobin: 13.3 g/dL (ref 12.0–15.0)
MCH: 28.7 pg (ref 26.0–34.0)
MCHC: 32.7 g/dL (ref 30.0–36.0)
MCV: 87.9 fL (ref 80.0–100.0)
Platelets: 289 K/uL (ref 150–400)
RBC: 4.63 MIL/uL (ref 3.87–5.11)
RDW: 13.3 % (ref 11.5–15.5)
WBC: 13.8 K/uL — ABNORMAL HIGH (ref 4.0–10.5)
nRBC: 0 % (ref 0.0–0.2)

## 2023-12-24 LAB — URINALYSIS, ROUTINE W REFLEX MICROSCOPIC
Bilirubin Urine: NEGATIVE
Glucose, UA: NEGATIVE mg/dL
Ketones, ur: NEGATIVE mg/dL
Leukocytes,Ua: NEGATIVE
Nitrite: NEGATIVE
Protein, ur: NEGATIVE mg/dL
Specific Gravity, Urine: 1.016 (ref 1.005–1.030)
WBC, UA: 0 WBC/hpf (ref 0–5)
pH: 5 (ref 5.0–8.0)

## 2023-12-24 LAB — PREGNANCY, URINE: Preg Test, Ur: NEGATIVE

## 2023-12-24 LAB — LIPASE, BLOOD: Lipase: 28 U/L (ref 11–51)

## 2023-12-24 NOTE — ED Triage Notes (Signed)
 Patient ambulatory to triage with complaints of worsening abdominal pain x5 hours. States she felt nauseous all day yesterday without pain or vomiting.  Patients pain initially was at the umbilicus but moved to LLQ. Denies vomiting, endorses only being able to get out small bowel movement. Denies prior abdominal surgery. Daughter translated for patient during triage.

## 2023-12-25 ENCOUNTER — Encounter: Payer: Self-pay | Admitting: General Surgery

## 2023-12-25 ENCOUNTER — Observation Stay: Admitting: Anesthesiology

## 2023-12-25 ENCOUNTER — Other Ambulatory Visit: Payer: Self-pay

## 2023-12-25 ENCOUNTER — Observation Stay
Admission: EM | Admit: 2023-12-25 | Discharge: 2023-12-25 | Disposition: A | Discharge status: Home / Self Care | Attending: Emergency Medicine | Admitting: Emergency Medicine

## 2023-12-25 ENCOUNTER — Emergency Department

## 2023-12-25 ENCOUNTER — Encounter
Admission: EM | Disposition: A | Discharge status: Home / Self Care | Payer: Self-pay | Source: Home / Self Care | Attending: Emergency Medicine

## 2023-12-25 DIAGNOSIS — K3580 Unspecified acute appendicitis: Principal | ICD-10-CM | POA: Diagnosis present

## 2023-12-25 HISTORY — PX: LAPAROSCOPIC APPENDECTOMY: SHX408

## 2023-12-25 SURGERY — APPENDECTOMY, LAPAROSCOPIC
Anesthesia: General | Site: Abdomen

## 2023-12-25 MED ORDER — ONDANSETRON HCL 4 MG/2ML IJ SOLN: 4.0000 mg | Freq: Four times a day (QID) | INTRAMUSCULAR | Status: DC | PRN

## 2023-12-25 MED ORDER — SUGAMMADEX SODIUM 200 MG/2ML IV SOLN
INTRAVENOUS | Status: DC | PRN
  Administered 2023-12-25: 200 mg via INTRAVENOUS

## 2023-12-25 MED ORDER — LACTATED RINGERS IV SOLN: INTRAVENOUS | Status: DC

## 2023-12-25 MED ORDER — KETAMINE HCL 50 MG/5ML IJ SOSY
PREFILLED_SYRINGE | INTRAMUSCULAR | Status: DC | PRN
Start: 2023-12-25 — End: 2023-12-25
  Administered 2023-12-25: 30 mg via INTRAVENOUS

## 2023-12-25 MED ORDER — HYDROMORPHONE HCL 1 MG/ML IJ SOLN: 0.2500 mg | INTRAMUSCULAR | Status: DC | PRN

## 2023-12-25 MED ORDER — DEXAMETHASONE SODIUM PHOSPHATE 10 MG/ML IJ SOLN
INTRAMUSCULAR | Status: DC | PRN
  Administered 2023-12-25: 10 mg via INTRAVENOUS

## 2023-12-25 MED ORDER — LACTATED RINGERS IV BOLUS
1000.0000 mL | Freq: Once | INTRAVENOUS | Status: AC
  Administered 2023-12-25: 1000 mL via INTRAVENOUS

## 2023-12-25 MED ORDER — OXYCODONE HCL 5 MG/5ML PO SOLN: 5.0000 mg | Freq: Once | ORAL | Status: DC | PRN

## 2023-12-25 MED ORDER — DEXMEDETOMIDINE HCL IN NACL 80 MCG/20ML IV SOLN
INTRAVENOUS | Status: DC | PRN
Start: 2023-12-25 — End: 2023-12-25
  Administered 2023-12-25: 8 ug via INTRAVENOUS

## 2023-12-25 MED ORDER — LIDOCAINE HCL (CARDIAC) PF 100 MG/5ML IV SOSY
PREFILLED_SYRINGE | INTRAVENOUS | Status: DC | PRN
  Administered 2023-12-25: 100 mg via INTRAVENOUS

## 2023-12-25 MED ORDER — OXYCODONE HCL 5 MG PO TABS: 5.0000 mg | ORAL_TABLET | Freq: Four times a day (QID) | ORAL | 0 refills | Status: AC | PRN

## 2023-12-25 MED ORDER — IOHEXOL 300 MG/ML  SOLN
100.0000 mL | Freq: Once | INTRAMUSCULAR | Status: AC | PRN
  Administered 2023-12-25: 100 mL via INTRAVENOUS

## 2023-12-25 MED ORDER — ROCURONIUM BROMIDE 10 MG/ML (PF) SYRINGE
PREFILLED_SYRINGE | INTRAVENOUS | Status: AC
  Filled 2023-12-25: qty 10

## 2023-12-25 MED ORDER — ONDANSETRON HCL 4 MG/2ML IJ SOLN
4.0000 mg | INTRAMUSCULAR | Status: AC
  Administered 2023-12-25: 4 mg via INTRAVENOUS
  Filled 2023-12-25: qty 2

## 2023-12-25 MED ORDER — ONDANSETRON HCL 4 MG/2ML IJ SOLN
INTRAMUSCULAR | Status: AC
  Filled 2023-12-25: qty 2

## 2023-12-25 MED ORDER — 0.9 % SODIUM CHLORIDE (POUR BTL) OPTIME
TOPICAL | Status: DC | PRN
  Administered 2023-12-25: 500 mL

## 2023-12-25 MED ORDER — BUPIVACAINE-EPINEPHRINE (PF) 0.5% -1:200000 IJ SOLN
INTRAMUSCULAR | Status: AC
  Filled 2023-12-25: qty 30

## 2023-12-25 MED ORDER — ROCURONIUM BROMIDE 100 MG/10ML IV SOLN
INTRAVENOUS | Status: DC | PRN
  Administered 2023-12-25: 50 mg via INTRAVENOUS

## 2023-12-25 MED ORDER — ONDANSETRON 4 MG PO TBDP
4.0000 mg | ORAL_TABLET | Freq: Four times a day (QID) | ORAL | Status: DC | PRN
Start: 2023-12-25 — End: 2023-12-25

## 2023-12-25 MED ORDER — MORPHINE SULFATE (PF) 4 MG/ML IV SOLN
4.0000 mg | Freq: Once | INTRAVENOUS | Status: AC
  Administered 2023-12-25: 4 mg via INTRAVENOUS
  Filled 2023-12-25: qty 1

## 2023-12-25 MED ORDER — MIDAZOLAM HCL 2 MG/2ML IJ SOLN
INTRAMUSCULAR | Status: DC | PRN
  Administered 2023-12-25: 2 mg via INTRAVENOUS

## 2023-12-25 MED ORDER — PROPOFOL 10 MG/ML IV BOLUS
INTRAVENOUS | Status: AC
  Filled 2023-12-25: qty 20

## 2023-12-25 MED ORDER — LIDOCAINE HCL (PF) 2 % IJ SOLN
INTRAMUSCULAR | Status: AC
  Filled 2023-12-25: qty 5

## 2023-12-25 MED ORDER — OXYCODONE HCL 5 MG PO TABS: 5.0000 mg | ORAL_TABLET | ORAL | Status: DC | PRN

## 2023-12-25 MED ORDER — PIPERACILLIN-TAZOBACTAM 3.375 G IVPB 30 MIN
3.3750 g | Freq: Once | INTRAVENOUS | Status: AC
  Administered 2023-12-25: 3.375 g via INTRAVENOUS
  Filled 2023-12-25 (×2): qty 50

## 2023-12-25 MED ORDER — MIDAZOLAM HCL 2 MG/2ML IJ SOLN
INTRAMUSCULAR | Status: AC
  Filled 2023-12-25: qty 2

## 2023-12-25 MED ORDER — OXYCODONE HCL 5 MG PO TABS: 5.0000 mg | ORAL_TABLET | Freq: Once | ORAL | Status: DC | PRN

## 2023-12-25 MED ORDER — FENTANYL CITRATE (PF) 100 MCG/2ML IJ SOLN
INTRAMUSCULAR | Status: AC
  Filled 2023-12-25: qty 2

## 2023-12-25 MED ORDER — DEXAMETHASONE SODIUM PHOSPHATE 10 MG/ML IJ SOLN
INTRAMUSCULAR | Status: AC
  Filled 2023-12-25: qty 1

## 2023-12-25 MED ORDER — KETAMINE HCL 50 MG/5ML IJ SOSY
PREFILLED_SYRINGE | INTRAMUSCULAR | Status: AC
  Filled 2023-12-25: qty 5

## 2023-12-25 MED ORDER — PIPERACILLIN-TAZOBACTAM 3.375 G IVPB: 3.3750 g | Freq: Three times a day (TID) | INTRAVENOUS | Status: DC

## 2023-12-25 MED ORDER — PROPOFOL 10 MG/ML IV BOLUS
INTRAVENOUS | Status: DC | PRN
  Administered 2023-12-25: 150 mg via INTRAVENOUS

## 2023-12-25 MED ORDER — CEFAZOLIN SODIUM-DEXTROSE 2-4 GM/100ML-% IV SOLN
INTRAVENOUS | Status: AC
  Filled 2023-12-25: qty 100

## 2023-12-25 MED ORDER — ACETAMINOPHEN 10 MG/ML IV SOLN
INTRAVENOUS | Status: DC | PRN
  Administered 2023-12-25: 1000 mg via INTRAVENOUS

## 2023-12-25 MED ORDER — ACETAMINOPHEN 10 MG/ML IV SOLN
INTRAVENOUS | Status: AC
  Filled 2023-12-25: qty 100

## 2023-12-25 MED ORDER — FENTANYL CITRATE (PF) 100 MCG/2ML IJ SOLN
INTRAMUSCULAR | Status: DC | PRN
  Administered 2023-12-25 (×2): 50 ug via INTRAVENOUS

## 2023-12-25 MED ORDER — PHENYLEPHRINE 80 MCG/ML (10ML) SYRINGE FOR IV PUSH (FOR BLOOD PRESSURE SUPPORT)
PREFILLED_SYRINGE | INTRAVENOUS | Status: DC | PRN
  Administered 2023-12-25: 80 ug via INTRAVENOUS

## 2023-12-25 MED ORDER — ONDANSETRON HCL 4 MG/2ML IJ SOLN
INTRAMUSCULAR | Status: DC | PRN
  Administered 2023-12-25: 4 mg via INTRAVENOUS

## 2023-12-25 MED ORDER — CEFAZOLIN SODIUM-DEXTROSE 2-3 GM-%(50ML) IV SOLR
INTRAVENOUS | Status: DC | PRN
  Administered 2023-12-25: 2 g via INTRAVENOUS

## 2023-12-25 MED ORDER — BUPIVACAINE-EPINEPHRINE (PF) 0.5% -1:200000 IJ SOLN
INTRAMUSCULAR | Status: DC | PRN
  Administered 2023-12-25: 30 mL

## 2023-12-25 MED ORDER — KETOROLAC TROMETHAMINE 30 MG/ML IJ SOLN
INTRAMUSCULAR | Status: DC | PRN
  Administered 2023-12-25: 30 mg via INTRAVENOUS

## 2023-12-25 SURGICAL SUPPLY — 37 items
CABLE HIGH FREQUENCY MONO STRZ (ELECTRODE) ×1 IMPLANT
CLIP APPLIE ROT 10 11.4 M/L (STAPLE) IMPLANT
CUTTER FLEX LINEAR 45M (STAPLE) IMPLANT
DEFOGGER SCOPE WARM SEASHARP (MISCELLANEOUS) ×1 IMPLANT
DERMABOND ADVANCED .7 DNX12 (GAUZE/BANDAGES/DRESSINGS) ×1 IMPLANT
DRAIN CHANNEL JP 19F RND 3/16 (MISCELLANEOUS) IMPLANT
ELECTRODE REM PT RTRN 9FT ADLT (ELECTROSURGICAL) ×1 IMPLANT
EVACUATOR SILICONE 100CC (DRAIN) IMPLANT
GLOVE BIOGEL PI IND STRL 7.5 (GLOVE) ×1 IMPLANT
GLOVE SURG SYN 7.0 PF PI (GLOVE) ×1 IMPLANT
GOWN STRL REUS W/ TWL LRG LVL3 (GOWN DISPOSABLE) ×2 IMPLANT
GRASPER SUT TROCAR 14GX15 (MISCELLANEOUS) ×1 IMPLANT
IRRIGATION STRYKERFLOW (MISCELLANEOUS) IMPLANT
IV NS 1000ML BAXH (IV SOLUTION) IMPLANT
KIT PINK PAD W/HEAD ARM REST (MISCELLANEOUS) ×1 IMPLANT
KIT TURNOVER KIT A (KITS) ×1 IMPLANT
MANIFOLD NEPTUNE II (INSTRUMENTS) ×1 IMPLANT
NDL HYPO 22X1.5 SAFETY MO (MISCELLANEOUS) ×1 IMPLANT
NDL INSUFFLATION 14GA 120MM (NEEDLE) ×1 IMPLANT
NEEDLE HYPO 22X1.5 SAFETY MO (MISCELLANEOUS) ×1 IMPLANT
NEEDLE INSUFFLATION 14GA 120MM (NEEDLE) ×1 IMPLANT
NS IRRIG 500ML POUR BTL (IV SOLUTION) ×1 IMPLANT
PACK LAP CHOLECYSTECTOMY (MISCELLANEOUS) ×1 IMPLANT
RELOAD STAPLE 45 2.5 WHT GRN (ENDOMECHANICALS) IMPLANT
RELOAD STAPLE 45 3.5 BLU ETS (ENDOMECHANICALS) IMPLANT
SCISSORS LAP 5X35 DISP (ENDOMECHANICALS) IMPLANT
SET TUBE SMOKE EVAC HIGH FLOW (TUBING) ×1 IMPLANT
SLEEVE Z-THREAD 5X100MM (TROCAR) ×1 IMPLANT
SUT ETHILON 2 0 FS 18 (SUTURE) IMPLANT
SUT MNCRL AB 4-0 PS2 18 (SUTURE) ×1 IMPLANT
SUT VICRYL 0 UR6 27IN ABS (SUTURE) ×1 IMPLANT
SYSTEM BAG RETRIEVAL 10MM (BASKET) ×1 IMPLANT
TRAP FLUID SMOKE EVACUATOR (MISCELLANEOUS) ×1 IMPLANT
TRAY FOLEY MTR SLVR 16FR STAT (SET/KITS/TRAYS/PACK) ×1 IMPLANT
TROCAR Z-THREAD FIOS 12X100MM (TROCAR) ×1 IMPLANT
TROCAR Z-THREAD FIOS 5X100MM (TROCAR) ×1 IMPLANT
WATER STERILE IRR 500ML POUR (IV SOLUTION) ×1 IMPLANT

## 2023-12-25 NOTE — Anesthesia Preprocedure Evaluation (Addendum)
 Anesthesia Evaluation  Patient identified by MRN, date of birth, ID band Patient awake    Reviewed: Allergy & Precautions, NPO status , Patient's Chart, lab work & pertinent test results  History of Anesthesia Complications Negative for: history of anesthetic complications  Airway Mallampati: II  TM Distance: >3 FB Neck ROM: full    Dental no notable dental hx.    Pulmonary neg pulmonary ROS   Pulmonary exam normal        Cardiovascular negative cardio ROS Normal cardiovascular exam     Neuro/Psych  Headaches PSYCHIATRIC DISORDERS Anxiety      Neuromuscular disease    GI/Hepatic negative GI ROS, Neg liver ROS,,,  Endo/Other  negative endocrine ROS    Renal/GU      Musculoskeletal   Abdominal   Peds  Hematology negative hematology ROS (+)   Anesthesia Other Findings Past Medical History: /: Anginal pain (HCC)     Comment:  chest pain probably related to anxiety, per pt. No date: Anxiety 12/13/2016: Cancer (HCC)     Comment:  Left Breast (DCIS) 08/2017: Migraine headache     Comment:  tylenol  and pain medicine helps this 2018: Personal history of radiation therapy     Comment:  left breast DCIS No date: Seasonal allergies  Past Surgical History: No date: ABDOMINAL HYSTERECTOMY 11/30/2016: BREAST BIOPSY; Left     Comment:  left breast calcs LIQ high grade DCIS.  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  06/01/2017: BREAST BIOPSY; Left     Comment:  Left Affirm- of LIQ calcs, re bx of area biopsied in               12/2016 that was disconcordant, epithelial changes,               CALCIFICATION ASSOCIATED WITH RADIATION EFFECT and                fibroadenomatoid.  01/02/2017: BREAST LUMPECTOMY; Left     Comment:  DCIS and fibroadenoma with phyllodes excisied. SN bx, LN              negative 01/02/2017: BREAST LUMPECTOMY WITH SENTINEL LYMPH NODE BIOPSY; Left     Comment:  Procedure: BREAST LUMPECTOMY WITH SENTINEL LYMPH NODE               BX;  Surgeon: Shelva Dunnings, MD;  Location: [facility] ORS;               Service: General;  Laterality: Left; 09/18/2017: LAPAROSCOPIC OVARIAN CYSTECTOMY; Left     Comment:  Procedure: LAPAROSCOPIC OVARIAN CYSTECTOMY (x 2);                Surgeon: Schermerhorn, Debby PARAS, MD;  Location: [facility];              Service: Gynecology;  Laterality: Left; 09/18/2017: LAPAROSCOPIC VAGINAL HYSTERECTOMY WITH SALPINGECTOMY;  Bilateral     Comment:  Procedure: LAPAROSCOPIC ASSISTED VAGINAL HYSTERECTOMY               WITH SALPINGECTOMY;  Surgeon: Schermerhorn, Debby PARAS,  MD;              Location: [facility];  Service: Gynecology;  Laterality:               Bilateral; No date: TUBAL LIGATION  BMI    Body Mass Index: 27.25 kg/m      Reproductive/Obstetrics negative OB ROS                              Anesthesia Physical Anesthesia Plan  ASA: 2  Anesthesia Plan: General ETT   Post-op Pain Management: Toradol  IV (intra-op)*, Ofirmev  IV (intra-op)* and Dilaudid  IV   Induction: Intravenous  PONV Risk Score and Plan: 3 and Ondansetron , Dexamethasone , Midazolam  and Treatment may vary due to age or medical condition  Airway Management Planned: Oral ETT  Additional Equipment:   Intra-op Plan:   Post-operative Plan: Extubation in OR  Informed Consent: I have reviewed the patients History and Physical, chart, labs and discussed the procedure including the risks, benefits and alternatives for the proposed anesthesia with the patient or authorized representative who has indicated his/her understanding and acceptance.     Dental Advisory Given and Interpreter used  for interview  Plan Discussed with: Anesthesiologist, CRNA and Surgeon  Anesthesia Plan Comments: (Patient consented for risks of anesthesia including but not limited to:  - adverse reactions to medications - damage to eyes, teeth, lips or other oral mucosa - nerve damage due to positioning  - sore throat or hoarseness - Damage to heart, brain, nerves, lungs, other parts of body or loss of life  Patient voiced understanding and assent.)         Anesthesia Quick Evaluation

## 2023-12-25 NOTE — ED Provider Notes (Signed)
 [facility] Provider Note    Event Date/Time   First MD Initiated Contact with Patient 12/25/23 0102     (approximate)   History   Abdominal Pain  The patient and/or family speak(s) Spanish.  They understand they have the right to the use of a hospital interpreter, however at this time they prefer to speak directly with me in Spanish.  They know that they can ask for an interpreter at any time.   HPI Sherry Short is a 48 y.o. female who presents for evaluation of about 1 to 2 days of pain in her right lower quadrant.  She said that it started out gradual and has become worse.  No nausea or vomiting.  1 episode of diarrhea.  Pain is constant and does not come and go.  Denies fever, chest pain, shortness of breath, and dysuria.  Patient had a tubal ligation and subsequently had a hysterectomy     Physical Exam   Triage [place] Signs: ED Triage Vitals [12/24/23 2126]  Encounter Vitals Group     BP (!) 146/98     Girls Systolic BP Percentile      Girls Diastolic BP Percentile      Boys Systolic BP Percentile      Boys Diastolic BP Percentile      Pulse Rate 80     Resp 20     Temp 98.2 F (36.8 C)     Temp Source Oral     SpO2 98 %     Weight 67.6 kg (149 lb)     Height 1.575 m (5' 2)     Head Circumference      Peak Flow      Pain Score 10     Pain Loc      Pain Education      Exclude from Growth Chart     Most recent Torok signs: Vitals:   12/24/23 2126 12/25/23 0116  BP: (!) 146/98 106/73  Pulse: 80 89  Resp: 20 17  Temp: 98.2 F (36.8 C) 97.9 F (36.6 C)  SpO2: 98% 100%    General: Awake, appears uncomfortable but nontoxic. CV:  Good peripheral perfusion.  Resp:  Normal effort. Speaking easily and comfortably, no accessory muscle usage nor intercostal retractions.   Abd:  No distention.  Tender to palpation throughout the abdomen but more exquisitely tender in the right lower quadrant with guarding.   ED Results / Procedures  / Treatments   Labs (all labs ordered are listed, but only abnormal results are displayed) Labs Reviewed  COMPREHENSIVE METABOLIC PANEL WITH GFR - Abnormal; Notable for the following components:      Result Value   Glucose, Bld 119 (*)    Calcium 8.8 (*)    All other components within normal limits  CBC - Abnormal; Notable for the following components:   WBC 13.8 (*)    All other components within normal limits  URINALYSIS, ROUTINE W REFLEX MICROSCOPIC - Abnormal; Notable for the following components:   Color, Urine YELLOW (*)    APPearance CLEAR (*)    Hgb urine dipstick MODERATE (*)    Bacteria, UA RARE (*)    All other components within normal limits  URINE CULTURE  LIPASE, BLOOD  PREGNANCY, URINE       RADIOLOGY See ED course for details   PROCEDURES:  Critical Care performed: No  Procedures    IMPRESSION / MDM / ASSESSMENT AND PLAN / ED COURSE  I reviewed the triage [place] signs and the nursing notes.                              Differential diagnosis includes, but is not limited to, appendicitis, mesenteric adenitis, diverticulitis, less likely STD/PID/TOA.  Patient's presentation is most consistent with acute presentation with potential threat to life or bodily function.  Labs/studies ordered: CMP, CBC, lipase, urine pregnancy, urine culture, urinalysis, abdominal x-ray, CT of the abdomen and pelvis.  Interventions/Medications given:  Medications  piperacillin -tazobactam (ZOSYN ) IVPB 3.375 g (3.375 g Intravenous New Bag/Given 12/25/23 0307)  lactated ringers  bolus 1,000 mL (has no administration in time range)  morphine  (PF) 4 MG/ML injection 4 mg (4 mg Intravenous Given 12/25/23 0147)  ondansetron  (ZOFRAN ) injection 4 mg (4 mg Intravenous Given 12/25/23 0146)  iohexol  (OMNIPAQUE ) 300 MG/ML solution 100 mL (100 mLs Intravenous Contrast Given 12/25/23 0218)  morphine  (PF) 4 MG/ML injection 4 mg (4 mg Intravenous Given 12/25/23 0306)    (Note:  hospital  course my include additional interventions and/or labs/studies not listed above.)   Vitals are stable and within normal limits, afebrile.  Labs are all essentially normal other than a leukocytosis of 13.8.  Patient has some hemoglobinuria and rare bacteria, could represent UTI given that she is post hysterectomy and the blood does most likely represent hematuria rather than vaginal blood.  I ordered a urine culture.  Will proceed with CT of the abdomen and pelvis to evaluate further the right lower quadrant pain with leukocytosis.  Morphine  4 mg IV and Zofran  4 g IV for pain and nausea prevention.  Patient agrees with plan.   Clinical Course as of 12/25/23 0327  Mon Dec 25, 2023  0245 CT ABDOMEN PELVIS W CONTRAST I independently viewed and interpreted the patient's CT of the abdomen and pelvis and she seems to have some inflammation in the right lower quadrant.  Radiologist confirmed acute uncomplicated appendicitis.  I updated the patient who is experiencing more pain.  I will consult surgery to admit the patient, provide additional morphine  4 mg IV, and likely antibiotics after consulting with surgery [CF]  [phone] Paging Dr. Marinda again [CF]  [phone] Consulted by phone with Dr. Marinda who will admit the patient. [CF]    Clinical Course User Index [CF] Gordan Huxley, MD     FINAL CLINICAL IMPRESSION(S) / ED DIAGNOSES   Final diagnoses:  Acute appendicitis, uncomplicated     Rx / DC Orders   ED Discharge Orders     None        Note:  This document was prepared using Dragon voice recognition software and may include unintentional dictation errors.   Gordan Huxley, MD 12/25/23 [phone]

## 2023-12-25 NOTE — Op Note (Signed)
 Operative note Preoperative diagnosis: Appendicitis Postoperative diagnosis: Acute appendicitis Surgeon: Jayson Hobby, MD Procedure: Laparoscopic appendectomy EBL: 10 cc  After informed consent was obtained the patient was brought to the operating room and placed supine on the operating room table.  General history of anesthesia was then induced.  A Foley catheter was placed and her left arm was tucked with all pressure points padded.  Her abdomen was then draped in the usual sterile fashion.  A surgical timeout was called identifying correct patient, site, side and procedure.  A small incision was made in the left upper quadrant at Palmer's point.  A Veress needle was inserted using standard drop technique and pneumoperitoneum was then established.  A an infraumbilical incision was made after infiltration of half percent Marcaine  solution.  A 5 mm Optiview trocar was then placed under direct visualization.  There was noted to be no injury at the site of Veress needle insertion.  A 12 mm left lower quadrant port and 5 mm suprapubic port were then placed under direct visualization after infiltration with local anesthetic.  The patient was positioned with head down.  The small bowel was swept out of the right lower quadrant and the appendix was visualized.  The appendix did not appear inflamed.  The mesoappendix was then grasped and elevated to the anterior abdominal wall.  A window was made with the [place]  at the base of the appendix.  The appendix was transected using a 45 mm Endo GIA blue load stapler.  The mesoappendix was then taken with a 45 mm white vascular load stapler.  The appendix was then placed in the Endo Catch bag.  The right lower quadrant was then examined and section of minimal blood products.  The staple lines were examined and appeared to be intact without any bleeding.  The appendix was then removed from the 12 mm port site.  The 12 mm port site fascia was then closed with a 0 Vicryl on  a suture needle passer.  The abdomen is then desufflated.  The incisions were closed with 4-0 Monocryl and dressed with glue.  Prior to termination of the procedure all sponge and instrument counts were correct x 2.  The patient was then awoken from general endotracheal anesthesia and taken to the PACU in good condition.

## 2023-12-25 NOTE — Anesthesia Procedure Notes (Signed)
 Procedure Name: Intubation Date/Time: 12/25/2023 9:53 AM  Performed by: Myra Lawless, CRNAPre-anesthesia Checklist: Patient identified, Patient being monitored, Timeout performed, Emergency Drugs available and Suction available Patient Re-evaluated:Patient Re-evaluated prior to induction Oxygen Delivery Method: Circle system utilized Preoxygenation: Pre-oxygenation with 100% oxygen Induction Type: IV induction Ventilation: Mask ventilation without difficulty Laryngoscope Size: Mac, 3 and McGrath Grade View: Grade I Tube type: Oral Tube size: 6.5 mm Number of attempts: 1 Airway Equipment and Method: Stylet Placement Confirmation: ETT inserted through vocal cords under direct vision, positive ETCO2 and breath sounds checked- equal and bilateral Secured at: 19 cm Tube secured with: Tape Dental Injury: Teeth and Oropharynx as per pre-operative assessment  Comments: Smooth IV induction. Easy mask ventilation. DL x1 with McGrath MAC 3, grade 1 view. Atraumatic intubation. Dentition unchanged from preop baseline.

## 2023-12-25 NOTE — Discharge Summary (Signed)
 Patient ID: Sherry Short MRN: [id number] DOB/AGE: May 08, 1975 48 y.o.  Admit date: 12/25/2023 Discharge date: 12/25/2023   Discharge Diagnoses:  Principal Problem:   Acute appendicitis, uncomplicated   Procedures:Acute appendicitis  Hospital Course:   admitted with findings consistent with acute appendicitis and  was taken promptly to the operating room for an uneventful laparoscopic appendectomy.    The time of discharge the patient was ambulating,  pain was controlled.  Her Hinzman signs were stable and she was afebrile.   physical exam at discharge showed a pt  in no acute distress.  Awake and alert.  Abdomen: Soft incisions healing well without infection or peritonitis.  Extremities well-perfused and no edema.  Condition of the patient the time of discharge was stable   Consults: None  Disposition: Discharge disposition: 01-Home or Self Care       Discharge Instructions     Call MD for:  difficulty breathing, headache or visual disturbances   Complete by: As directed    Call MD for:  extreme fatigue   Complete by: As directed    Call MD for:  hives   Complete by: As directed    Call MD for:  persistant dizziness or light-headedness   Complete by: As directed    Call MD for:  persistant nausea and vomiting   Complete by: As directed    Call MD for:  redness, tenderness, or signs of infection (pain, swelling, redness, odor or green/yellow discharge around incision site)   Complete by: As directed    Call MD for:  severe uncontrolled pain   Complete by: As directed    Call MD for:  temperature >100.4   Complete by: As directed    Diet - low sodium heart healthy   Complete by: As directed    Discharge instructions   Complete by: As directed    You have surgical glue over your incisions.  This will peel off over the next 3 to 4 weeks.  He may shower on postoperative day 1.  Please let the warm water running your incisions and pat dry.  Please do not submerge the wounds  in water for 2 weeks.  Please refrain from heavy lifting greater than 10 to 15 pounds for 4 weeks.  You may use Tylenol  and ibuprofen  as well as ice to the incisions to help with swelling and pain.  You should follow-up in our office in 2 weeks.   Increase activity slowly   Complete by: As directed         Follow-up Information     Schulz, Zachary R, PA-C Follow up in 2 week(s).   Specialty: Physician Assistant Contact information: [address] [place] [address] [phone]                  Jayson Endow, M.D.

## 2023-12-25 NOTE — Transfer of Care (Signed)
 Immediate Anesthesia Transfer of Care Note  Patient: Sherry Short  Procedure(s) Performed: APPENDECTOMY, LAPAROSCOPIC (Abdomen)  Patient Location: PACU  Anesthesia Type:General  Level of Consciousness: drowsy  Airway & Oxygen Therapy: Patient Spontanous Breathing and Patient connected to face mask oxygen  Post-op Assessment: Report given to RN, Post -op Sylvain signs reviewed and stable, and Patient moving all extremities X 4  Post Symonds signs: Reviewed and stable  Last Vitals:  Vitals Value Taken Time  BP 127/72 12/25/23 10:47  Temp 37.3 C 12/25/23 10:47  Pulse 80 12/25/23 10:50  Resp 22 12/25/23 10:50  SpO2 100 % 12/25/23 10:50  Vitals shown include unfiled device data.  Last Pain:  Vitals:   12/25/23 0930  TempSrc: Temporal  PainSc: 10-Worst pain ever     Patent airway to PACU, breathing spontaneously on 6L O2 via FM. Pt appears comfortable. VSS.  Patients Stated Pain Goal: 0 (12/25/23 0930)  Complications: No notable events documented.

## 2023-12-25 NOTE — Anesthesia Postprocedure Evaluation (Signed)
 Anesthesia Post Note  Patient: Sherry Short  Procedure(s) Performed: APPENDECTOMY, LAPAROSCOPIC (Abdomen)  Patient location during evaluation: PACU Anesthesia Type: General Level of consciousness: awake and alert Pain management: pain level controlled Schertzer Signs Assessment: post-procedure Lippens signs reviewed and stable Respiratory status: spontaneous breathing, nonlabored ventilation, respiratory function stable and patient connected to nasal cannula oxygen Cardiovascular status: blood pressure returned to baseline and stable Postop Assessment: no apparent nausea or vomiting Anesthetic complications: no   No notable events documented.   Last Vitals:  Vitals:   12/25/23 1130 12/25/23 1135  BP: 113/73   Pulse: 87 83  Resp: 14 14  Temp:    SpO2: 96% 95%    Last Pain:  Vitals:   12/25/23 1130  TempSrc:   PainSc: 0-No pain                 [name]

## 2023-12-25 NOTE — H&P (Signed)
 [facility] SURGICAL HISTORY & PHYSICAL (cpt [phone])  HISTORY OF PRESENT ILLNESS (HPI):  48 y.o. female presented to [facility] ED overnight for abdominal pain. Patient reports a 1-2 day history of RLQ abdominal pain which has been worsening since the onset. No fever, chills, CP, SOB, nausea, emesis with the pain. She has no history of similar in the past. Only previous abdominal surgery is hysterectomy and tubal ligation. Work up in the ED revealed a leukocytosis to 13.8K, Hgb to 13.3, sCr - 0.76, no electrolyte derangements. CT Abdomen/Pelvis was concerning for acute appendicitis.   General surgery is consulted by emergency medicine physician Dr Darleene Dome, MD for evaluation and management of acute appendicitis.   PAST MEDICAL HISTORY (PMH):  Past Medical History:  Diagnosis Date   Anginal pain (HCC) /   chest pain probably related to anxiety, per pt.   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 Otherwise negative.   PAST SURGICAL HISTORY [facility]):  Past Surgical History:  Procedure Laterality Date   ABDOMINAL HYSTERECTOMY     BREAST BIOPSY Left 11/30/2016   left breast calcs LIQ high grade DCIS.  AND us  bx 1:00 10 cmfn fibroadenoma and phyllodes tumor   BREAST BIOPSY Left 12/13/2016   Left Affrim Bx central calcs,  Ususal ductal hyperplasia   BREAST BIOPSY Left 12/13/2016   Left Affirm Bx- LIQ calcs, columnar cell change, disconcordant and excision recommened.    BREAST BIOPSY Left 06/01/2017   Left Affirm- of LIQ calcs, re bx of area biopsied in 12/2016 that was disconcordant, epithelial changes, CALCIFICATION ASSOCIATED WITH RADIATION EFFECT and fibroadenomatoid.    BREAST LUMPECTOMY Left 01/02/2017   DCIS and fibroadenoma with phyllodes excisied. SN bx, LN negative   BREAST LUMPECTOMY WITH  SENTINEL LYMPH NODE BIOPSY Left 01/02/2017   Procedure: BREAST LUMPECTOMY WITH SENTINEL LYMPH NODE BX;  Surgeon: Shelva Dunnings, MD;  Location: [facility] ORS;  Service: General;  Laterality: Left;   LAPAROSCOPIC OVARIAN CYSTECTOMY Left 09/18/2017   Procedure: LAPAROSCOPIC OVARIAN CYSTECTOMY (x 2);  Surgeon: Schermerhorn, Debby PARAS, MD;  Location: [facility] ORS;  Service: Gynecology;  Laterality: Left;   LAPAROSCOPIC VAGINAL HYSTERECTOMY WITH SALPINGECTOMY Bilateral 09/18/2017   Procedure: LAPAROSCOPIC ASSISTED VAGINAL HYSTERECTOMY WITH SALPINGECTOMY;  Surgeon: Schermerhorn, Debby PARAS, MD;  Location: [facility] ORS;  Service: Gynecology;  Laterality: Bilateral;   TUBAL LIGATION      Reviewed. Otherwise negative.   MEDICATIONS:  Prior to Admission medications   Medication Sig Start Date End Date Taking? Authorizing Provider  amitriptyline  (ELAVIL ) 25 MG tablet Take 1 tablet (25 mg total) by mouth at bedtime. Patient not taking: Reported on 12/25/2023 04/29/21   Marcelino Nurse, MD  tamoxifen  (NOLVADEX ) 20 MG tablet Take 1 tablet (20 mg total) by mouth daily. Patient not taking: Reported on 12/25/2023 12/24/21   Tonette Lauraine HERO, PA-C     ALLERGIES:  No Known Allergies   SOCIAL HISTORY:  Social History   Socioeconomic History   Marital status: Married    Spouse name: Not on file   Number of children: Not on file   Years of education: Not on file   Highest education level: Not on file  Occupational History   Not on file  Tobacco Use   Smoking status: Never   Smokeless tobacco:  Never  Vaping Use   Vaping status: Never Used  Substance and Sexual Activity   Alcohol use: No   Drug use: No   Sexual activity: Yes    Birth control/protection: Surgical  Other Topics Concern   Not on file  Social History Narrative   Not on file   Social Drivers of Health   Financial Resource Strain: Not on file  Food Insecurity: Not on file  Transportation Needs: Not on file  Physical Activity: Not on file   Stress: Not on file  Social Connections: Not on file  Intimate Partner Violence: Not on file     FAMILY HISTORY:  Family History  Problem Relation Age of Onset   Breast cancer Sister 15   Healthy Mother    Diabetes Father     Otherwise negative.   REVIEW OF SYSTEMS:  Review of Systems  Constitutional:  Negative for chills and fever.  Respiratory:  Negative for cough and shortness of breath.   Cardiovascular:  Negative for chest pain and palpitations.  Gastrointestinal:  Positive for abdominal pain. Negative for diarrhea, nausea and vomiting.  Genitourinary:  Negative for dysuria and urgency.  All other systems reviewed and are negative.   Console SIGNS:  Temp:  [97.9 F (36.6 C)-98.2 F (36.8 C)] 97.9 F (36.6 C) (09/15 0116) Pulse Rate:  [80-89] 89 (09/15 0116) Resp:  [17-20] 17 (09/15 0116) BP: (106-146)/(73-98) 106/73 (09/15 0116) SpO2:  [98 %-100 %] 100 % (09/15 0116) Weight:  [67.6 kg] 67.6 kg (09/14 2126)     Height: 5' 2 (157.5 cm) Weight: 67.6 kg BMI (Calculated): 27.25   PHYSICAL EXAM:  Physical Exam Vitals and nursing note reviewed. Exam conducted with a chaperone present.  Constitutional:      General: She is not in acute distress.    Appearance: She is well-developed. She is not ill-appearing.  HENT:     Head: Normocephalic and atraumatic.  Eyes:     General: No scleral icterus.    Extraocular Movements: Extraocular movements intact.  Cardiovascular:     Rate and Rhythm: Normal rate.     Heart sounds: Normal heart sounds.  Pulmonary:     Effort: Pulmonary effort is normal. No respiratory distress.  Abdominal:     General: Abdomen is flat. A surgical scar is present. There is no distension.     Palpations: Abdomen is soft.     Tenderness: There is abdominal tenderness in the right lower quadrant. There is no guarding or rebound. Positive signs include McBurney's sign. Negative signs include Murphy's sign.  Genitourinary:    Comments:  Deferred Skin:    General: Skin is warm and dry.     Findings: No erythema.  Neurological:     General: No focal deficit present.     Mental Status: She is alert. She is disoriented.  Psychiatric:        Mood and Affect: Mood normal.        Behavior: Behavior normal.     INTAKE/OUTPUT:  This shift: No intake/output data recorded.  Last 2 shifts: @IOLAST2SHIFTS @  Labs:     Latest Ref Rng & Units 12/24/2023    9:29 PM 10/12/2021   10:43 AM 09/19/2017    5:16 AM  CBC  WBC 4.0 - 10.5 K/uL 13.8  18.5  11.6   Hemoglobin 12.0 - 15.0 g/dL 86.6  86.1  89.8   Hematocrit 36.0 - 46.0 % 40.7  42.2  29.5   Platelets 150 - 400 K/uL  289  300  210       Latest Ref Rng & Units 12/24/2023    9:29 PM 10/12/2021   10:43 AM 09/19/2017    5:16 AM  CMP  Glucose 70 - 99 mg/dL 880  875  875   BUN 6 - 20 mg/dL 17  9  10    Creatinine 0.44 - 1.00 mg/dL 9.23  9.28  9.41   Sodium 135 - 145 mmol/L 136  137  137   Potassium 3.5 - 5.1 mmol/L 4.0  4.1  4.1   Chloride 98 - 111 mmol/L 105  105  110   CO2 22 - 32 mmol/L 22  25  20    Calcium 8.9 - 10.3 mg/dL 8.8  9.3  8.4   Total Protein 6.5 - 8.1 g/dL 7.6     Total Bilirubin 0.0 - 1.2 mg/dL 0.8     Alkaline Phos 38 - 126 U/L 85     AST 15 - 41 U/L 20     ALT 0 - 44 U/L 16       Imaging studies:   CT Abdomen/Pelvis (12/25/2023) personally reviewed with acute uncomplicated appendicitis, and radiologist report reviewed below:  IMPRESSION: Changes consistent with acute uncomplicated appendicitis.   Assessment/Plan:  48 y.o. female with acute uncomplicated appendicitis.    - Will plan for laparoscopic appendectomy this afternoon with Dr Marinda pending OR.Anesthesia availability - All risks, benefits, and alternatives to above procedure(s) were discussed with the patient, all of her questions were answered to her expressed satisfaction, patient expresses she wishes to proceed, and informed consent was obtained.    - NPO for planned surgery; IVF - IV Abx  (Zosyn )   - Monitor abdominal examination - Pain control prn; antiemetics prn   - Okay to mobilize  All of the above findings and recommendations were discussed with the patient and her family, and all of their questions were answered to their expressed satisfaction.  -- Arthea Platt, PA-C [facility] 12/25/2023, 7:34 AM M-F: 7am - 4pm

## 2023-12-26 ENCOUNTER — Encounter: Payer: Self-pay | Admitting: General Surgery

## 2023-12-26 LAB — SURGICAL PATHOLOGY

## 2023-12-27 LAB — URINE CULTURE: Culture: >=100000 — AB

## 2023-12-29 ENCOUNTER — Encounter: Payer: Self-pay | Admitting: General Surgery

## 2024-01-10 ENCOUNTER — Ambulatory Visit (INDEPENDENT_AMBULATORY_CARE_PROVIDER_SITE_OTHER): Payer: Self-pay | Admitting: Physician Assistant

## 2024-01-10 ENCOUNTER — Encounter: Payer: Self-pay | Admitting: Physician Assistant

## 2024-01-10 VITALS — BP 113/77 | HR 93 | Temp 98.3°F | Ht 61.0 in | Wt 149.0 lb

## 2024-01-10 DIAGNOSIS — Z09 Encounter for follow-up examination after completed treatment for conditions other than malignant neoplasm: Secondary | ICD-10-CM

## 2024-01-10 DIAGNOSIS — K3580 Unspecified acute appendicitis: Secondary | ICD-10-CM

## 2024-01-10 NOTE — Patient Instructions (Signed)

## 2024-01-10 NOTE — Progress Notes (Signed)
  [facility] VISIT  01/10/2024  HPI: Sherry Short is a 48 y.o. female 16 days s/p laparoscopic appendectomy for acute appendicitis  She is doing well She is sore at camera port but this is improved No fever, chills, nausea, emesis, bowel changes Incisions are well healed No other complaints   Hausler signs: BP 113/77   Pulse 93   Temp 98.3 F (36.8 C) (Oral)   Ht 5' 1 (1.549 m)   Wt 149 lb (67.6 kg)   LMP 09/04/2017 (Exact Date)   SpO2 98%   BMI 28.15 kg/m    Physical Exam: Constitutional: Well appearing female, NAD Abdomen: Soft, non-tender, non-distended, no rebound/guarding Skin: Laparoscopic incisions are healing well, no erythema or drainage   Assessment/Plan: This is a 48 y.o. female 16 days s/p laparoscopic appendectomy for acute appendicitis   - Pain control prn  - Reviewed wound care recommendation  - Reviewed lifting restrictions; 4 weeks total - Work note given  - Reviewed surgical pathology; Appendicitis  - She can follow up on as needed basis; She understands to call with questions/concerns  -- Arthea Platt, PA-C  [facility] 01/10/2024, 2:47 PM M-F: 7am - 4pm

## 2024-01-19 ENCOUNTER — Ambulatory Visit
Admission: RE | Admit: 2024-01-19 | Discharge: 2024-01-19 | Disposition: A | Discharge status: Home / Self Care | Source: Ambulatory Visit | Attending: Family Medicine | Admitting: Family Medicine

## 2024-01-19 DIAGNOSIS — Z1231 Encounter for screening mammogram for malignant neoplasm of breast: Secondary | ICD-10-CM | POA: Insufficient documentation
